# Patient Record
Sex: Female | Born: 1970
Health system: Southern US, Community
[De-identification: ages and names within clinical notes are randomized; demographics above are authoritative.]

## PROBLEM LIST (undated history)

## (undated) DIAGNOSIS — Z8739 Personal history of other diseases of the musculoskeletal system and connective tissue: Secondary | ICD-10-CM

## (undated) DIAGNOSIS — M1990 Unspecified osteoarthritis, unspecified site: Secondary | ICD-10-CM

## (undated) DIAGNOSIS — I10 Essential (primary) hypertension: Secondary | ICD-10-CM

## (undated) DIAGNOSIS — Z460 Encounter for fitting and adjustment of spectacles and contact lenses: Secondary | ICD-10-CM

## (undated) HISTORY — DX: Encounter for fitting and adjustment of spectacles and contact lenses: Z46.0

## (undated) HISTORY — DX: Unspecified osteoarthritis, unspecified site: M19.90

## (undated) HISTORY — DX: Essential (primary) hypertension: I10

## (undated) HISTORY — DX: Personal history of other diseases of the musculoskeletal system and connective tissue: Z87.39

---

## 1998-12-27 ENCOUNTER — Encounter: Admission: RE | Admit: 1998-12-27 | Discharge: 1999-01-18 | Payer: Self-pay

## 2000-01-07 ENCOUNTER — Other Ambulatory Visit: Admission: RE | Admit: 2000-01-07 | Discharge: 2000-01-07 | Payer: Self-pay | Admitting: Family Medicine

## 2000-09-25 ENCOUNTER — Encounter: Admission: RE | Admit: 2000-09-25 | Discharge: 2000-12-24 | Payer: Self-pay | Admitting: Surgical Oncology

## 2001-10-31 ENCOUNTER — Emergency Department (HOSPITAL_COMMUNITY): Admission: EM | Admit: 2001-10-31 | Discharge: 2001-11-01 | Payer: Self-pay | Admitting: Emergency Medicine

## 2001-11-01 ENCOUNTER — Encounter: Payer: Self-pay | Admitting: Emergency Medicine

## 2003-01-12 ENCOUNTER — Emergency Department (HOSPITAL_COMMUNITY): Admission: EM | Admit: 2003-01-12 | Discharge: 2003-01-12 | Payer: Self-pay | Admitting: Emergency Medicine

## 2003-05-27 ENCOUNTER — Encounter: Admission: RE | Admit: 2003-05-27 | Discharge: 2003-07-20 | Payer: Self-pay | Admitting: Orthopedic Surgery

## 2003-09-12 ENCOUNTER — Ambulatory Visit (HOSPITAL_COMMUNITY): Admission: RE | Admit: 2003-09-12 | Discharge: 2003-09-12 | Payer: Self-pay | Admitting: Family Medicine

## 2004-02-27 ENCOUNTER — Emergency Department (HOSPITAL_COMMUNITY): Admission: EM | Admit: 2004-02-27 | Discharge: 2004-02-27 | Payer: Self-pay | Admitting: Emergency Medicine

## 2006-01-09 ENCOUNTER — Other Ambulatory Visit: Admission: RE | Admit: 2006-01-09 | Discharge: 2006-01-09 | Payer: Self-pay | Admitting: Obstetrics and Gynecology

## 2007-07-23 ENCOUNTER — Ambulatory Visit (HOSPITAL_COMMUNITY): Admission: RE | Admit: 2007-07-23 | Discharge: 2007-07-23 | Payer: Self-pay | Admitting: *Deleted

## 2007-07-24 ENCOUNTER — Ambulatory Visit (HOSPITAL_COMMUNITY): Admission: RE | Admit: 2007-07-24 | Discharge: 2007-07-24 | Payer: Self-pay | Admitting: *Deleted

## 2007-08-02 ENCOUNTER — Emergency Department (HOSPITAL_COMMUNITY): Admission: EM | Admit: 2007-08-02 | Discharge: 2007-08-03 | Payer: Self-pay | Admitting: Emergency Medicine

## 2007-08-20 ENCOUNTER — Encounter: Admission: RE | Admit: 2007-08-20 | Discharge: 2007-08-20 | Payer: Self-pay | Admitting: *Deleted

## 2008-02-10 ENCOUNTER — Encounter: Admission: RE | Admit: 2008-02-10 | Discharge: 2008-05-10 | Payer: Self-pay | Admitting: *Deleted

## 2008-02-23 ENCOUNTER — Ambulatory Visit (HOSPITAL_COMMUNITY): Admission: RE | Admit: 2008-02-23 | Discharge: 2008-02-24 | Payer: Self-pay | Admitting: Surgery

## 2008-02-23 HISTORY — PX: LAPAROSCOPIC GASTRIC BANDING: SHX1100

## 2008-06-29 ENCOUNTER — Encounter: Admission: RE | Admit: 2008-06-29 | Discharge: 2008-08-16 | Payer: Self-pay | Admitting: *Deleted

## 2011-04-09 NOTE — Op Note (Signed)
[name], [name]               ACCOUNT NO.:  [id number]   MEDICAL RECORD NO.:  [id number]          PATIENT TYPE:  AMB   LOCATION:  DAY                          FACILITY:  [facility]   PHYSICIAN:  Thornton Park. Daphine Deutscher, MD  DATE OF BIRTH:  21-Oct-1971   DATE OF PROCEDURE:  02/23/2008  DATE OF DISCHARGE:                               OPERATIVE REPORT   PREOPERATIVE DIAGNOSIS:  A 40 year old female with morbid obesity.  BMI  of 41 presents for lap band surgery.   PROCEDURE:  Laparoscopic adjustable band (Allergan) APS placed with an  antislip suture.   SURGEON:  Thornton Park. Daphine Deutscher, MD   ASSISTANT:  None.   ANESTHESIA:  General endotracheal.   DESCRIPTION OF PROCEDURE:  [name] was taken to room 1 and given  general anesthesia.  The abdomen was prepped with Techni-Care and draped  sterilely.  Access was gained through the left upper quadrant using a 0-  degree OptiVu scope, entering the abdomen without difficulty, and then  insufflating.  Standard trocar placements were used including a 5 in the  upper midline to introduce the Nathanson's retractor.  A 15 in the right  upper midline, and a 10/11 just below that.  I subsequently inverted the  port.   The camera port was placed on the left, above the umbilicus.  Abdomen  was surveyed and no abnormalities were noted.  The liver retracted.  I  incised the angle of His, and created a space there fore the band  passer.  I took down the gastrohepatic omentum, and identified the  medial border of the right crus, and then did the standard pars flaccida  approach, passing the band passer, from that point around to the  previously made point on the angle of His.  That came through easily.  It appeared to be right for an APS band which I then placed in the  abdomen, threading it through the band passer, bringing it around, and  snapping the belt buckle on over the of the calibration tube.  Once the  band was snapped in place, it was held down  toward the feet, and then I  plicated anteriorly with free suture of Surgitek, with free sutures and  tie knots.  A little plication stitch toward the lesser curvature was  used to try to prevent herniation through the band.   The band was then placed in the right lower side, and thereafter trocars  removed, the abdomen was deflated, created a space, connected the port,  and then secured it the fascia with four sutures of 2-0 Prolene.   The wound was irrigated well, and all wounds were closed 4-0 Vicryl  subcutaneously and with staples.  The patient tolerated the procedure  and was taken to recovery room in satisfactory condition.      Thornton Park Daphine Deutscher, MD  Electronically Signed     MBM/MEDQ  D:  02/23/2008  T:  02/23/2008  Job:  [id number]   cc:   Shade Flood, M.D.  Fax: [phone]

## 2011-08-19 LAB — HEMOGLOBIN AND HEMATOCRIT, BLOOD
HCT: 38.8
Hemoglobin: 13.2

## 2011-08-19 LAB — PREGNANCY, URINE: Preg Test, Ur: NEGATIVE

## 2011-08-20 LAB — DIFFERENTIAL
Basophils Absolute: 0
Basophils Relative: 1
Eosinophils Absolute: 0
Eosinophils Relative: 0
Lymphocytes Relative: 24
Lymphs Abs: 1.8
Monocytes Absolute: 0.4
Monocytes Relative: 5
Neutro Abs: 5.1
Neutrophils Relative %: 70

## 2011-08-20 LAB — CBC
HCT: 34 — ABNORMAL LOW
Hemoglobin: 11.9 — ABNORMAL LOW
MCHC: 35
MCV: 83.8
Platelets: 282
RBC: 4.06
RDW: 14.3
WBC: 7.3

## 2011-11-12 ENCOUNTER — Ambulatory Visit (INDEPENDENT_AMBULATORY_CARE_PROVIDER_SITE_OTHER): Payer: Federal, State, Local not specified - PPO

## 2011-11-12 DIAGNOSIS — J029 Acute pharyngitis, unspecified: Secondary | ICD-10-CM

## 2011-11-12 DIAGNOSIS — J111 Influenza due to unidentified influenza virus with other respiratory manifestations: Secondary | ICD-10-CM

## 2011-11-12 DIAGNOSIS — R509 Fever, unspecified: Secondary | ICD-10-CM

## 2012-02-10 ENCOUNTER — Ambulatory Visit (INDEPENDENT_AMBULATORY_CARE_PROVIDER_SITE_OTHER): Payer: Federal, State, Local not specified - PPO | Admitting: Physician Assistant

## 2012-02-10 VITALS — BP 142/90 | HR 87 | Temp 97.9°F | Resp 16 | Ht 69.0 in | Wt 215.0 lb

## 2012-02-10 DIAGNOSIS — M545 Low back pain, unspecified: Secondary | ICD-10-CM

## 2012-02-10 DIAGNOSIS — M62838 Other muscle spasm: Secondary | ICD-10-CM

## 2012-02-10 DIAGNOSIS — M549 Dorsalgia, unspecified: Secondary | ICD-10-CM | POA: Insufficient documentation

## 2012-02-10 MED ORDER — HYDROCODONE-ACETAMINOPHEN 5-500 MG PO TABS: 1.0000 | ORAL_TABLET | Freq: Three times a day (TID) | ORAL | Status: AC | PRN

## 2012-02-10 MED ORDER — NAPROXEN 500 MG PO TABS: 500.0000 mg | ORAL_TABLET | Freq: Two times a day (BID) | ORAL | Status: DC

## 2012-02-10 MED ORDER — METHOCARBAMOL 500 MG PO TABS: 500.0000 mg | ORAL_TABLET | Freq: Four times a day (QID) | ORAL | Status: AC

## 2012-02-10 NOTE — Patient Instructions (Signed)
Back Pain, Adult Low back pain is very common. About 1 in 5 people have back pain.The cause of low back pain is rarely dangerous. The pain often gets better over time.About half of people with a sudden onset of back pain feel better in just 2 weeks. About 8 in 10 people feel better by 6 weeks.  CAUSES Some common causes of back pain include:  Strain of the muscles or ligaments supporting the spine.   Wear and tear (degeneration) of the spinal discs.   Arthritis.   Direct injury to the back.  DIAGNOSIS Most of the time, the direct cause of low back pain is not known.However, back pain can be treated effectively even when the exact cause of the pain is unknown.Answering your caregiver's questions about your overall health and symptoms is one of the most accurate ways to make sure the cause of your pain is not dangerous. If your caregiver needs more information, he or she may order lab work or imaging tests (X-rays or MRIs).However, even if imaging tests show changes in your back, this usually does not require surgery. HOME CARE INSTRUCTIONS For many people, back pain returns.Since low back pain is rarely dangerous, it is often a condition that people can learn to manageon their own.   Remain active. It is stressful on the back to sit or stand in one place. Do not sit, drive, or stand in one place for more than 30 minutes at a time. Take short walks on level surfaces as soon as pain allows.Try to increase the length of time you walk each day.   Do not stay in bed.Resting more than 1 or 2 days can delay your recovery.   Do not avoid exercise or work.Your body is made to move.It is not dangerous to be active, even though your back may hurt.Your back will likely heal faster if you return to being active before your pain is gone.   Pay attention to your body when you bend and lift. Many people have less discomfortwhen lifting if they bend their knees, keep the load close to their  bodies,and avoid twisting. Often, the most comfortable positions are those that put less stress on your recovering back.   Find a comfortable position to sleep. Use a firm mattress and lie on your side with your knees slightly bent. If you lie on your back, put a pillow under your knees.   Only take over-the-counter or prescription medicines as directed by your caregiver. Over-the-counter medicines to reduce pain and inflammation are often the most helpful.Your caregiver may prescribe muscle relaxant drugs.These medicines help dull your pain so you can more quickly return to your normal activities and healthy exercise.   Put ice on the injured area.   Put ice in a plastic bag.   Place a towel between your skin and the bag.   Leave the ice on for 15 to 20 minutes, 3 to 4 times a day for the first 2 to 3 days. After that, ice and heat may be alternated to reduce pain and spasms.   Ask your caregiver about trying back exercises and gentle massage. This may be of some benefit.   Avoid feeling anxious or stressed.Stress increases muscle tension and can worsen back pain.It is important to recognize when you are anxious or stressed and learn ways to manage it.Exercise is a great option.  SEEK MEDICAL CARE IF:  You have pain that is not relieved with rest or medicine.   You have   pain that does not improve in 1 week.   You have new symptoms.   You are generally not feeling well.  SEEK IMMEDIATE MEDICAL CARE IF:   You have pain that radiates from your back into your legs.   You develop new bowel or bladder control problems.   You have unusual weakness or numbness in your arms or legs.   You develop nausea or vomiting.   You develop abdominal pain.   You feel faint.  Document Released: 11/11/2005 Document Revised: 10/31/2011 Document Reviewed: 04/01/2011 Exit[employer] Patient Information 2012 [employer]. 

## 2012-02-10 NOTE — Progress Notes (Signed)
  Subjective:    Patient ID: Samantha [name], female    DOB: 1971/01/27, 41 y.o.   MRN: [id number]  HPI 41 y/o BF presents complaining of back pain.   History of recurrent low back pain, flaring roughly once annually.  Has had MRI years ago showing mild DDD without evidence of rupture or herniation.    Current episode began after a 2.5 hour car ride home from a weekend vacation.  Carried her suitcases, perhaps irritated her back with those two things.  Denies N/T/W of LE, no B/B dysfunction, no radiation of pain down her legs or saddle numbness.    Treatment in the past has included NSAIDs, muscle relaxers and opioid pain medicaitons which clears her pain within several days.  She is overweight and knows she needs to lose weight to prevent recurring issues.     Review of Systems Gen:  Overall healthy.  No fever. ABD:  No nausea, no abdominal pain GU:  No incontinence Musc:  Back pain, no leg pain Neuro:  No weakness, no numbness, no paresthesias of LE    Objective:   Physical Exam Gen:  Healthy appearing BF in mild discomfort d/t back pain.  Abdomen:  Non-tender, no flank pain Musc:  Moves slowly in exam room, walking gingerly.  Tender over right SI joint and right lumbar paraspinals without significant spasm.  ROM limited d/t pain, forward flexion most limited.   Neuro:  Reflexes 2+.  Strength 5/5 bilateral LE.  SLR negative bilaterally.      Assessment & Plan:  Back pain.  Musculoskeletal in nature.  Treat with Naprosyn 500mg  BID, Robaxin 500mg  QID, and HC/APAP 5/500 TID prn.  Advise stretching exercises and improving core strength to avoid re-injurying herself in the future.  No concerning signs for worsening disease on exam.

## 2012-02-24 ENCOUNTER — Encounter (INDEPENDENT_AMBULATORY_CARE_PROVIDER_SITE_OTHER): Payer: Self-pay | Admitting: Surgery

## 2012-02-26 ENCOUNTER — Encounter (INDEPENDENT_AMBULATORY_CARE_PROVIDER_SITE_OTHER): Payer: Self-pay | Admitting: Surgery

## 2012-02-26 ENCOUNTER — Ambulatory Visit (INDEPENDENT_AMBULATORY_CARE_PROVIDER_SITE_OTHER): Payer: Federal, State, Local not specified - PPO | Admitting: Surgery

## 2012-02-26 VITALS — BP 143/82 | HR 88 | Temp 98.0°F | Ht 70.5 in | Wt 215.0 lb

## 2012-02-26 DIAGNOSIS — Z9884 Bariatric surgery status: Secondary | ICD-10-CM

## 2012-02-26 DIAGNOSIS — Z4651 Encounter for fitting and adjustment of gastric lap band: Secondary | ICD-10-CM

## 2012-02-26 NOTE — Progress Notes (Signed)
Samantha [name] Body mass index is 30.41 kg/(m^2).  Having regurgitation:  no  Nocturnal reflux?  no  Amount of fill  0.5 Samantha [name] comes in today in followup for years out from her LAP-BAND APS with an anti-slip stitch. Her BMI is down to 38 she has lost at least 80 pounds since her surgery. She looks good but recently has been craving potato chips. She needed given for fill and we're seeing her today. She felt like she needed to more refill. Added half a cc to her band. I suggested she might have point to see me in 3 months if the case she is having some difficulty. Otherwise she looks great and I am proud at how she's done.

## 2012-11-23 ENCOUNTER — Ambulatory Visit (INDEPENDENT_AMBULATORY_CARE_PROVIDER_SITE_OTHER): Payer: Federal, State, Local not specified - PPO | Admitting: Family Medicine

## 2012-11-23 VITALS — BP 168/92 | HR 81 | Temp 98.8°F | Resp 18 | Ht 68.0 in | Wt 213.0 lb

## 2012-11-23 DIAGNOSIS — IMO0001 Reserved for inherently not codable concepts without codable children: Secondary | ICD-10-CM

## 2012-11-23 DIAGNOSIS — M546 Pain in thoracic spine: Secondary | ICD-10-CM

## 2012-11-23 DIAGNOSIS — M791 Myalgia, unspecified site: Secondary | ICD-10-CM

## 2012-11-23 DIAGNOSIS — M549 Dorsalgia, unspecified: Secondary | ICD-10-CM

## 2012-11-23 MED ORDER — CYCLOBENZAPRINE HCL 10 MG PO TABS: 10.0000 mg | ORAL_TABLET | Freq: Three times a day (TID) | ORAL | Status: DC | PRN

## 2012-11-23 MED ORDER — MELOXICAM 15 MG PO TABS: 15.0000 mg | ORAL_TABLET | Freq: Every day | ORAL | Status: DC

## 2012-11-23 MED ORDER — METHYLPREDNISOLONE ACETATE 80 MG/ML IJ SUSP
80.0000 mg | Freq: Once | INTRAMUSCULAR | Status: AC
  Administered 2012-11-23: 80 mg via INTRAMUSCULAR

## 2012-11-23 MED ORDER — HYDROCODONE-ACETAMINOPHEN 5-325 MG PO TABS: 1.0000 | ORAL_TABLET | Freq: Three times a day (TID) | ORAL | Status: DC | PRN

## 2012-11-23 NOTE — Progress Notes (Signed)
Subjective:    Patient ID: Samantha [name], female    DOB: [birth year], 41 y.o.   MRN: [id number] Chief Complaint  Patient presents with  . Back Pain    x 5 days  -NKI-    HPI  41 yo female with a history of chronic problems with her back for years.  Usually pain is in her right low back. This episode is different - she is currently here because she is experiencing bilateral upper back pain and soreness, cannot turn trunk without pain.  Earlier this month her chronic low back pain flaired to the point that she had to stay home from work.  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  No weakness or numbness in any extremities noted. No f/c.  No falls or dropping things, no decreased grasp. No changes in bowels/bladder. Has tried physical therapy, chiropractor, massage therapy w/o help.  Pain is confined to back Had MRI of low back about 15 yrs ago - has been having pain ever since epidural. Going to try acupuncture.  Past Medical History  Diagnosis Date  . Hypertension   . Contact lens/glasses fitting   . History of swelling of feet   . Arthritis    Current Outpatient Prescriptions on File Prior to Visit  Medication Sig Dispense Refill  . medroxyPROGESTERone (DEPO-PROVERA) 150 MG/ML injection Inject 150 mg into the muscle every 3 (three) months.       No Known Allergies  Review of Systems  Constitutional: Negative for fever and chills.  Gastrointestinal: Negative for nausea, vomiting, abdominal pain, diarrhea and constipation.  Genitourinary: Negative for urgency, frequency, decreased urine volume and difficulty urinating.  Musculoskeletal: Positive for myalgias and back pain. Negative for joint swelling, arthralgias and gait problem.  Neurological: Negative for weakness and numbness.  Hematological: Negative for adenopathy. Does not bruise/bleed easily.  Psychiatric/Behavioral: Positive for sleep disturbance.      BP 168/92  Pulse 81  Temp 98.8 F (37.1 C) (Oral)   Resp 18  Ht 5\' 8"  (1.727 m)  Wt 213 lb (96.616 kg)  BMI 32.39 kg/m2  SpO2 100% Objective:   Physical Exam  Constitutional: She is oriented to person, place, and time. She appears well-developed and well-nourished.  HENT:  Head: Normocephalic.  Eyes: Conjunctivae normal are normal. No scleral icterus.  Neck: Normal range of motion. Neck supple.  Cardiovascular: Normal rate, regular rhythm and normal heart sounds.   Pulmonary/Chest: Effort normal and breath sounds normal. No respiratory distress.  Musculoskeletal: Normal range of motion.       Cervical back: Normal.       Thoracic back: She exhibits tenderness, pain and spasm. She exhibits no bony tenderness and no deformity.       Lumbar back: She exhibits no bony tenderness and no deformity.       Back:       Pain in bilateral rhomboids immed proximal to lower scapula  Neurological: She is alert and oriented to person, place, and time. She has normal strength and normal reflexes. She displays normal reflexes. No sensory deficit. Coordination and gait normal.  Reflex Scores:      Bicep reflexes are 2+ on the right side and 2+ on the left side.      Brachioradialis reflexes are 2+ on the right side and 2+ on the left side.      Patellar reflexes are 2+ on the right side and 2+ on the left side. Skin: Skin is warm  and dry. No erythema.  Psychiatric: She has a normal mood and affect. Her behavior is normal.      Assessment & Plan:    1. Myalgia  cyclobenzaprine (FLEXERIL) 10 MG tablet, meloxicam (MOBIC) 15 MG tablet  2. Acute upper back pain - Pt reassured that I think her current pain is just muscle spasm - a common place for people to "hold stress" especially when sitting at a desk on the computer all day.  Rec heat and massage but pt declines as have not helped her low back in the past.  Pt requested a shot of treatment.  Treated w/ Depo-Medrol 80mg  IM x 1. Pt did request a prescription for "something for pain" in addition to the  mobic and flexeril, pt given #10 norco 5/325. methylPREDNISolone acetate (DEPO-MEDROL) injection 80 mg, HYDROcodone-acetaminophen (NORCO/VICODIN) 5-325 MG per tablet  3. Elevated blood pressure - pt attributes to pain but has been elevated at all 3 visits this year.  Pt has a h/o HTN but is not currently on treatment.  If elevated at next visit, rec starting hctz 25mg  qd. 4. Chronic low back pain - Pt is very young to have chronic LBP and having to miss 5 work days this year alone due to chronic back pain seems significant.  I encouraged pt to pursue further w/u since has been 15 yrs since last imaging.  She may want to consider repeat xray or even repeat MRI. Has failed PT, chiropractor, and massage so may need to consider referral to [facility] pain management for epidural steroid injections or more long term pain treatment with TCA, cymbalta, neurontin, etc if acupuncture does not work for her.   I advised pt that if her back pain continues and she wants to consider back injections (or even just trigger point injections), she RTC to discuss options.

## 2013-07-15 ENCOUNTER — Encounter (INDEPENDENT_AMBULATORY_CARE_PROVIDER_SITE_OTHER): Payer: Self-pay | Admitting: Surgery

## 2013-07-15 ENCOUNTER — Ambulatory Visit (INDEPENDENT_AMBULATORY_CARE_PROVIDER_SITE_OTHER): Payer: Federal, State, Local not specified - PPO | Admitting: Surgery

## 2013-07-15 ENCOUNTER — Telehealth (INDEPENDENT_AMBULATORY_CARE_PROVIDER_SITE_OTHER): Payer: Self-pay | Admitting: General Surgery

## 2013-07-15 VITALS — BP 138/100 | HR 72 | Temp 97.9°F | Resp 15 | Ht 71.0 in | Wt 231.2 lb

## 2013-07-15 DIAGNOSIS — Z4651 Encounter for fitting and adjustment of gastric lap band: Secondary | ICD-10-CM

## 2013-07-15 DIAGNOSIS — E049 Nontoxic goiter, unspecified: Secondary | ICD-10-CM

## 2013-07-15 NOTE — Telephone Encounter (Signed)
LMOM asking pt to return my call.  This is so that I may inform her that she is scheduled for an US of the head/neck at [facility] located at [address] on 07/19/13 at 10:15.

## 2013-07-15 NOTE — Progress Notes (Signed)
Lapband Fill Encounter Problem List:   Patient Active Problem List   Diagnosis Date Noted  . 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 Back pain 02/10/2012    Lavone Neri Body mass index is 32.26 kg/(m^2). Weight loss since surgery  65 pounds  Having regurgitation?:  No  Feel that they need a fill? Yes  Nocturnal reflux?  No  Amount of fill  0.9     Instructions given and weight loss goals discussed.    Ms. Bickley is hungry and a bit frustrated because since her last fill in April she has gained 16 pounds. She reports being hungry. She's not having reflux. Her primary care asked her about her thyroid because it seemed like it might be enlarged. On exam her thought she might have some fullness in her right thyroid lobe. We'll obtain a thyroid ultrasound. We'll also check thyroid functions. Will obtain a thyroid panel with TSH. I'll see her back in 6-8 weeks. She may cause and 3 and let us know how restricted she feels.  Matt B. Daphine Deutscher, MD, FACS

## 2013-07-15 NOTE — Patient Instructions (Addendum)

## 2013-07-19 ENCOUNTER — Other Ambulatory Visit: Payer: Self-pay

## 2014-01-20 ENCOUNTER — Encounter (INDEPENDENT_AMBULATORY_CARE_PROVIDER_SITE_OTHER): Payer: Federal, State, Local not specified - PPO

## 2014-02-03 ENCOUNTER — Ambulatory Visit (INDEPENDENT_AMBULATORY_CARE_PROVIDER_SITE_OTHER): Payer: Federal, State, Local not specified - PPO | Admitting: Physician Assistant

## 2014-02-03 ENCOUNTER — Encounter (INDEPENDENT_AMBULATORY_CARE_PROVIDER_SITE_OTHER): Payer: Self-pay

## 2014-02-03 VITALS — BP 120/86 | HR 82 | Temp 97.4°F | Resp 14 | Ht 71.0 in | Wt 241.2 lb

## 2014-02-03 DIAGNOSIS — Z4651 Encounter for fitting and adjustment of gastric lap band: Secondary | ICD-10-CM

## 2014-02-03 NOTE — Progress Notes (Signed)
  HISTORY: Samantha [name] is a 43 y.o.female who received an AP-Standard lap-band in March 2009 by Dr. Daphine DeutscherMartin. She comes in with 10 lbs weight gain since her last visit in August 2014 with Dr. Daphine DeutscherMartin where she received a 0.9 mL fill. She describes good restriction immediately following the fill but this has tapered off over the months. She denies regurgitation or reflux symptoms. Her physical activity has been minimal during the winter months but she's planning on picking this up as the weather improves.  VITAL SIGNS: Filed Vitals:   02/03/14 1438  BP: 120/86  Pulse: 82  Temp: 97.4 F (36.3 C)  Resp: 14    PHYSICAL EXAM: Physical exam reveals a very well-appearing 43 y.o.female in no apparent distress Neurologic: Awake, alert, oriented Psych: Bright affect, conversant Respiratory: Breathing even and unlabored. No stridor or wheezing Abdomen: Soft, nontender, nondistended to palpation. Incisions well-healed. No incisional hernias. Port easily palpated. Extremities: Atraumatic, good range of motion.  ASSESMENT: 43 y.o.  female  s/p AP-Standard lap-band.   PLAN: The patient's port was accessed with a 20G Huber needle without difficulty. Clear fluid was aspirated and 0.5 mL saline was added to the port to give a total predicted volume of 5 mL. This was after verification of 4.5 mL of fluid in the band. The patient was able to swallow water without difficulty following the procedure and was instructed to take clear liquids for the next 24-48 hours and advance slowly as tolerated.

## 2014-02-03 NOTE — Patient Instructions (Signed)

## 2014-05-09 ENCOUNTER — Ambulatory Visit (INDEPENDENT_AMBULATORY_CARE_PROVIDER_SITE_OTHER): Payer: Federal, State, Local not specified - PPO | Admitting: Family Medicine

## 2014-05-09 VITALS — BP 126/88 | HR 87 | Temp 98.0°F | Resp 20 | Ht 68.0 in | Wt 235.4 lb

## 2014-05-09 DIAGNOSIS — S239XXA Sprain of unspecified parts of thorax, initial encounter: Secondary | ICD-10-CM

## 2014-05-09 DIAGNOSIS — S29012A Strain of muscle and tendon of back wall of thorax, initial encounter: Secondary | ICD-10-CM

## 2014-05-09 DIAGNOSIS — M79605 Pain in left leg: Secondary | ICD-10-CM

## 2014-05-09 DIAGNOSIS — M79609 Pain in unspecified limb: Secondary | ICD-10-CM

## 2014-05-09 MED ORDER — CYCLOBENZAPRINE HCL 10 MG PO TABS: 10.0000 mg | ORAL_TABLET | Freq: Three times a day (TID) | ORAL | Status: DC | PRN

## 2014-05-09 MED ORDER — NAPROXEN 500 MG PO TABS: 500.0000 mg | ORAL_TABLET | Freq: Two times a day (BID) | ORAL | Status: DC

## 2014-05-09 MED ORDER — METHYLPREDNISOLONE ACETATE 80 MG/ML IJ SUSP
80.0000 mg | Freq: Once | INTRAMUSCULAR | Status: AC
  Administered 2014-05-09: 80 mg via INTRAMUSCULAR

## 2014-05-09 MED ORDER — HYDROCODONE-ACETAMINOPHEN 5-325 MG PO TABS: 1.0000 | ORAL_TABLET | ORAL | Status: DC | PRN

## 2014-05-09 NOTE — Progress Notes (Signed)
Subjective: Patient presents with history of having developed low back pain last couple days. She's been a lot of progress activities that might carotid on. Knows of no exact specifics stream. It also started hurting in her right shoulder. She had been doing a lot of moving and lifting. Has had problems intermittently with a lot of aches and pains in the past.  Objective: No acute distress. Neck his range of motion. She's tender just below her right scapula and down below that is somewhat deep to the scapula. Good range of motion of the shoulder which itself is nontender. Strength is good. Reflexes good. Back is tender in the lower and mid lumbar regions straight leg raising test negative. Extremities unremarkable. He can reflexes of the lower extremities normal. Strength good. Range of motion good.  Assessment: Low back pain Right shoulder strain  Plan: Muscle relaxants and anti-inflammatory medication. Return if not improving. Use ice and heat alternating.

## 2014-05-09 NOTE — Patient Instructions (Signed)
Take the cyclobenzaprine one 3 times daily as needed for muscle accident  Use the naproxen one twice daily for pain and inflammation  Take the hydrocodone on an as-needed basis for the pain, maximum at 4 hour intervals  Return if worse or not improving  Use ice, or alternate ice and heat on the painful areas several times daily

## 2014-05-10 ENCOUNTER — Encounter: Payer: Self-pay | Admitting: Family Medicine

## 2014-06-25 ENCOUNTER — Other Ambulatory Visit: Payer: Self-pay | Admitting: Family Medicine

## 2014-06-27 NOTE — Telephone Encounter (Signed)
Dr Alwyn RenHopper, do you want to RF or pt RTC?

## 2015-05-23 ENCOUNTER — Ambulatory Visit (INDEPENDENT_AMBULATORY_CARE_PROVIDER_SITE_OTHER): Payer: Federal, State, Local not specified - PPO | Admitting: Family Medicine

## 2015-05-23 ENCOUNTER — Ambulatory Visit (INDEPENDENT_AMBULATORY_CARE_PROVIDER_SITE_OTHER): Payer: Federal, State, Local not specified - PPO

## 2015-05-23 VITALS — BP 142/90 | HR 61 | Temp 98.2°F | Resp 18 | Ht 69.75 in | Wt 262.4 lb

## 2015-05-23 DIAGNOSIS — M79605 Pain in left leg: Secondary | ICD-10-CM | POA: Diagnosis not present

## 2015-05-23 DIAGNOSIS — M545 Low back pain: Secondary | ICD-10-CM

## 2015-05-23 LAB — POCT URINE PREGNANCY: Preg Test, Ur: NEGATIVE

## 2015-05-23 MED ORDER — HYDROCODONE-ACETAMINOPHEN 5-325 MG PO TABS: 1.0000 | ORAL_TABLET | Freq: Three times a day (TID) | ORAL | Status: DC | PRN

## 2015-05-23 MED ORDER — CYCLOBENZAPRINE HCL 10 MG PO TABS: 10.0000 mg | ORAL_TABLET | Freq: Two times a day (BID) | ORAL | Status: DC | PRN

## 2015-05-23 MED ORDER — METHYLPREDNISOLONE ACETATE 80 MG/ML IJ SUSP
80.0000 mg | Freq: Once | INTRAMUSCULAR | Status: AC
  Administered 2015-05-23: 80 mg via INTRAMUSCULAR

## 2015-05-23 NOTE — Patient Instructions (Signed)
You got a shot of steroids today Use the flexeril as needed for muscle spasm and the hydrocodone as needed for pain.  Remember that both of these medications can make you feel sleepy- taking both together will make you more sleepy! Let me know if you do not feel better soon- Sooner if worse.  Avoid NSAID medications such as aleve or ibuprofen for 2-3 days after your steroid injection; after this time period you can use the medications as needed

## 2015-05-23 NOTE — Progress Notes (Signed)
[facility] [address], [place] [id number] [phone]  Date:  05/23/2015   Name:  Samantha [name]   DOB:  11/15/1971   MRN:  [id number]  PCP:  Shade FloodGREENE,JEFFREY R, MD    Chief Complaint: Back Pain   History of Present Illness:  Samantha Neriamela D Krygier is a 44 y.o. very pleasant female patient who presents with the following:  Here today with complaint of back pain. She has had issues with her back for some time- we have seen her for this a few times since about 2013.  She has had back pain for about 18 years.  Her pain will wax and wane- current episode started about 4 days ago, much worse yesterday.   No history of back surgery.  She did have a couple of MRI scans in the distant pass.   Over the last few days she has tried some aleve, heating pad.   The pain does not go into her legs.  No leg numbness or weakness.  No bowel or bladder control issues  Wt Readings from Last 3 Encounters:  05/23/15 262 lb 6.4 oz (119.024 kg)  05/09/14 235 lb 6.4 oz (106.777 kg)  02/03/14 241 lb 3.2 oz (109.408 kg)   She has sx with her back maybe 3x a week, more severe pain once a week.   She tries to exercise but has not been to the gym in a couple of weeks due to car trouble and then her back issues  Last depo shot 6 months ago.  She did have some spotting a couple of weeks ago.    Patient Active Problem List   Diagnosis Date Noted  . 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 Back pain 02/10/2012    Past Medical History  Diagnosis Date  . Hypertension   . Contact lens/glasses fitting   . History of swelling of feet   . Arthritis     Past Surgical History  Procedure Laterality Date  . Laparoscopic gastric banding  02/23/2008  . Cesarean section  1997    History  Substance Use Topics  . Smoking status: Never Smoker   . Smokeless tobacco: Not on file  . Alcohol Use: Yes     Comment: ocassionally    Family History  Problem Relation Age of Onset  . Hypertension Mother    . Hypertension Father     No Known Allergies  Medication list has been reviewed and updated.  Current Outpatient Prescriptions on File Prior to Visit  Medication Sig Dispense Refill  . cyclobenzaprine (FLEXERIL) 10 MG tablet TAKE 1 TABLET (10 MG TOTAL) BY MOUTH 3 (THREE) TIMES DAILY AS NEEDED FOR MUSCLE SPASMS. (Patient not taking: Reported on 05/23/2015) 21 tablet 0  . HYDROcodone-acetaminophen (NORCO) 5-325 MG per tablet Take 1 tablet by mouth every 4 (four) hours as needed. (Patient not taking: Reported on 05/23/2015) 15 tablet 0  . naproxen (NAPROSYN) 500 MG tablet Take 1 tablet (500 mg total) by mouth 2 (two) times daily with a meal. (Patient not taking: Reported on 05/23/2015) 30 tablet 0   No current facility-administered medications on file prior to visit.    Review of Systems:  As per HPI- otherwise negative.   Physical Examination: Filed Vitals:   05/23/15 0911  BP: 142/90  Pulse: 61  Temp: 98.2 F (36.8 C)  Resp: 18   Filed Vitals:   05/23/15 0911  Height: 5' 9.75" (1.772 m)  Weight: 262 lb 6.4 oz (  119.024 kg)   Body mass index is 37.91 kg/(m^2). Ideal Body Weight: Weight in (lb) to have BMI = 25: 172.6  GEN: WDWN, NAD, Non-toxic, A & O x 3, obese, looks well except moving slowly with back pain HEENT: Atraumatic, Normocephalic. Neck supple. No masses, No LAD. Ears and Nose: No external deformity. CV: RRR, No M/G/R. No JVD. No thrill. No extra heart sounds. PULM: CTA B, no wheezes, crackles, rhonchi. No retractions. No resp. distress. No accessory muscle use. ABD: S, NT, ND\. She has tenderness and spasm in the right lower back muscles.  Restricted flexion due to pain No bony tenderness Normal BLE strength, sensation and DTR, negative SLR bilaterally  EXTR: No c/c/e NEURO Normal gait.  PSYCH: Normally interactive. Conversant. Not depressed or anxious appearing.  Calm demeanor.   Results for orders placed or performed in visit on 05/23/15  POCT urine  pregnancy  Result Value Ref Range   Preg Test, Ur Negative Negative    [facility] reading (PRIMARY) by  Dr. Patsy Lager. Lumbar spine: minimal degenerative change at L4/5  Assessment and Plan: Right low back pain, with sciatica presence unspecified - Plan: DG Lumbar Spine Complete, POCT urine pregnancy, cyclobenzaprine (FLEXERIL) 10 MG tablet, HYDROcodone-acetaminophen (NORCO/VICODIN) 5-325 MG per tablet, methylPREDNISolone acetate (DEPO-MEDROL) injection 80 mg  Left leg pain  Recurrent lower back pain- seems muscular in origin. She has had success with depo- medrol in the past and requests same.  Also will treat with flexeril/ vicodin as needed Follow-up if not better soon Amenorrhea due to recent depo- provera use, HCG negative   Signed Abbe Amsterdam, MD

## 2016-06-10 IMAGING — CR DG LUMBAR SPINE COMPLETE 4+V
4 series · 4 of 4 positions shown · non-contrast
Comparison: None.

CLINICAL DATA: Right-sided low back pain, no injury

EXAM:
LUMBAR SPINE - COMPLETE 4+ VIEW

[AP (1 of 2)]
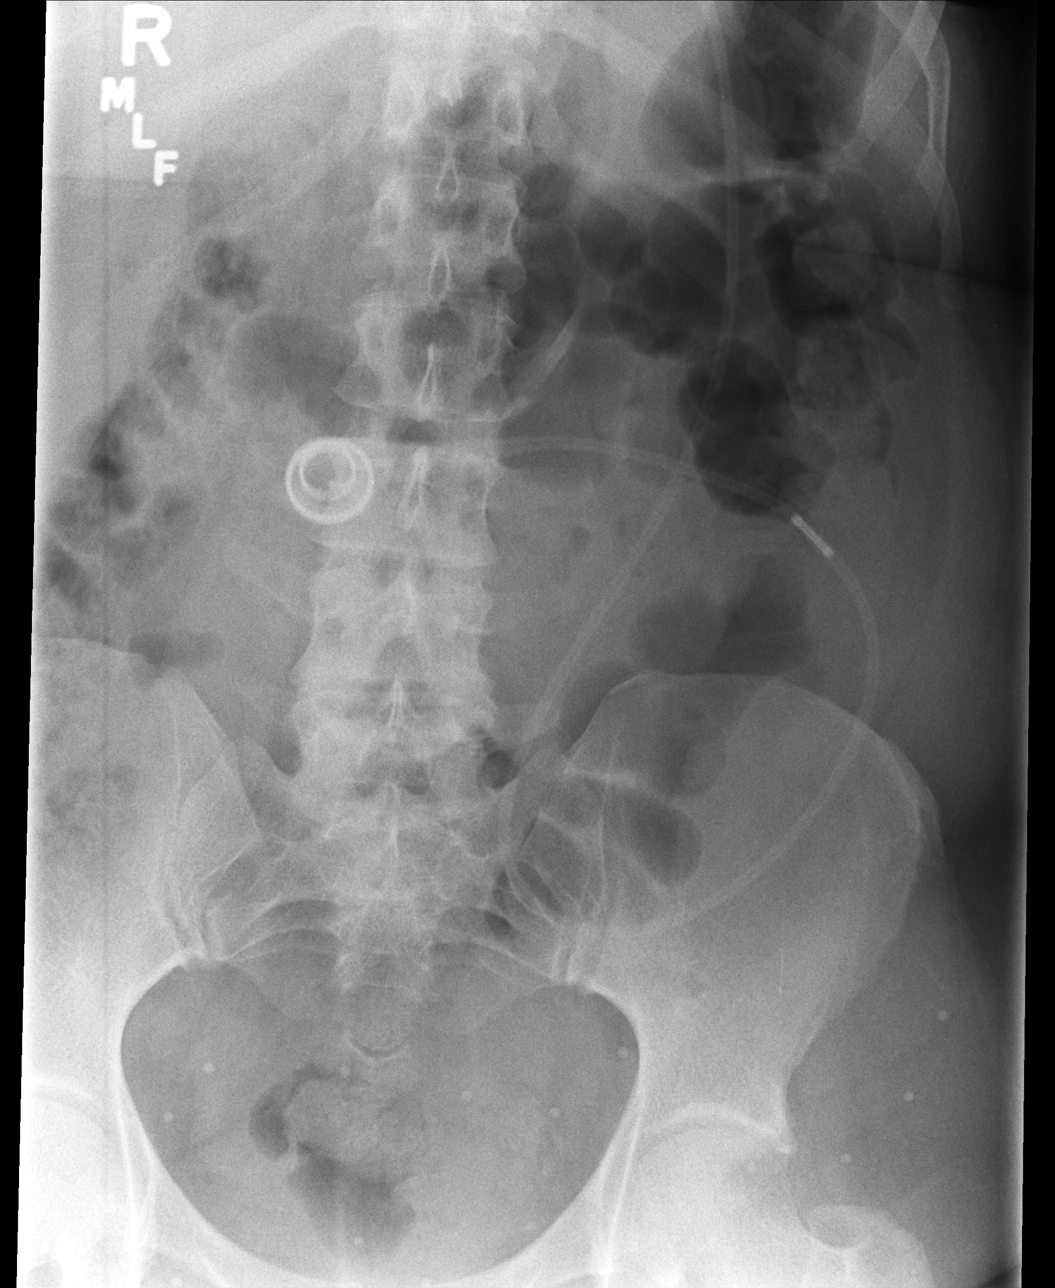

[rpo]
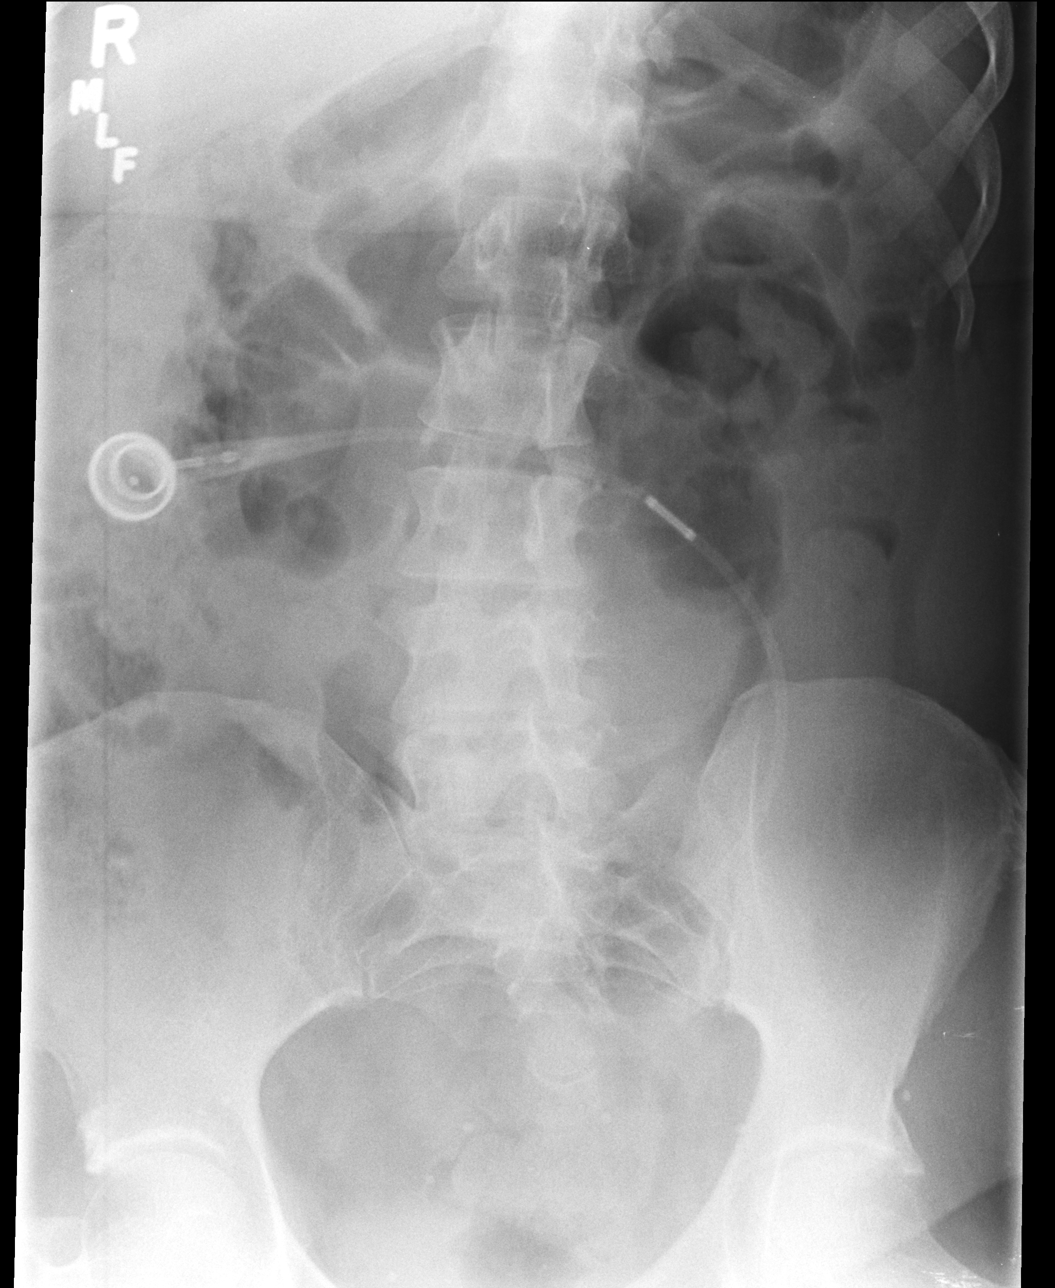

[lpo]
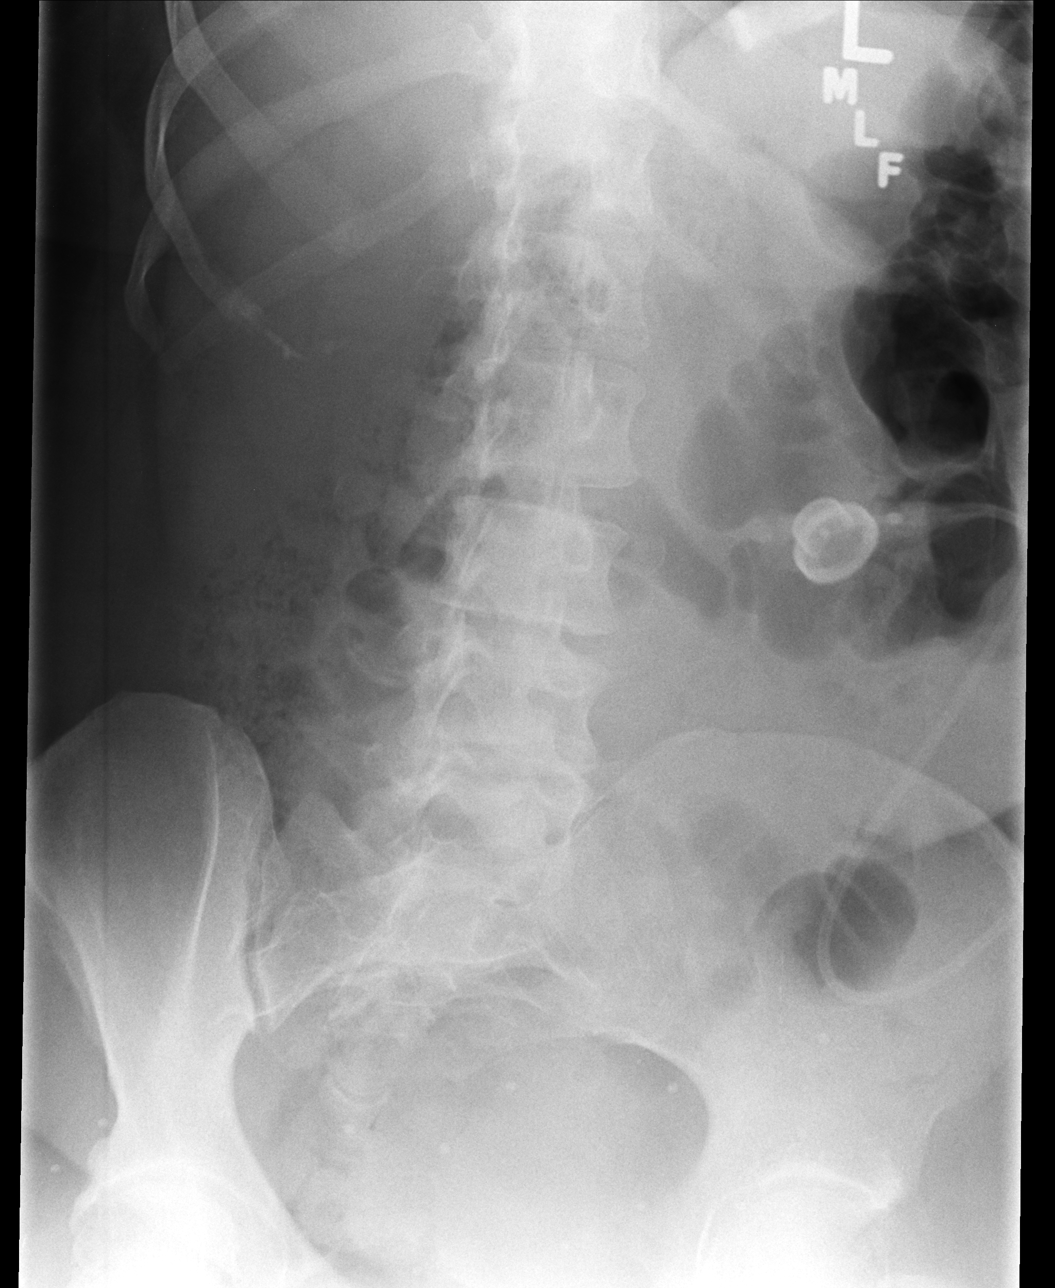

[AP (2 of 2)]
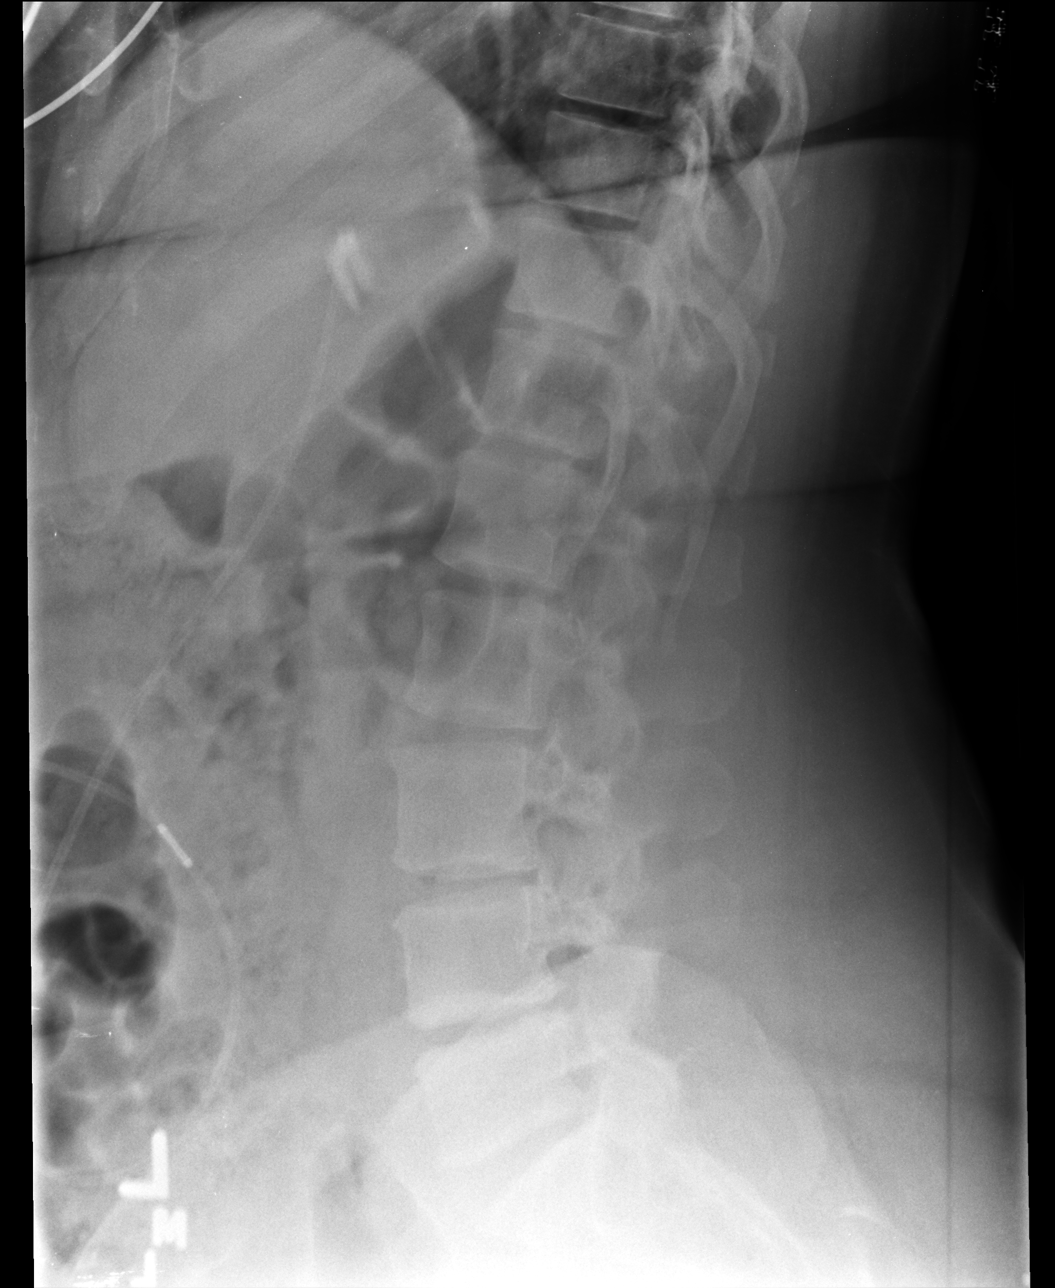

[4 of 4 positions shown; findings below may reference images not displayed]

FINDINGS: The lumbar vertebrae are in normal alignment. There is degenerative
disc disease at L4-5 where there is some loss of disc space and
sclerosis with mild spurring. No compression deformity is seen. A
lap band is noted although the band is not completely imaged.
IMPRESSION: Normal alignment.  Degenerative disc disease at L4-5.

## 2016-07-10 DIAGNOSIS — K080 Exfoliation of teeth due to systemic causes: Secondary | ICD-10-CM | POA: Diagnosis not present

## 2016-09-11 ENCOUNTER — Encounter: Payer: Self-pay | Admitting: Family Medicine

## 2016-09-11 ENCOUNTER — Ambulatory Visit (INDEPENDENT_AMBULATORY_CARE_PROVIDER_SITE_OTHER): Payer: Federal, State, Local not specified - PPO | Admitting: Family Medicine

## 2016-09-11 VITALS — BP 126/82 | HR 89 | Temp 98.6°F | Resp 18 | Ht 69.75 in | Wt 268.0 lb

## 2016-09-11 DIAGNOSIS — M545 Low back pain: Secondary | ICD-10-CM | POA: Diagnosis not present

## 2016-09-11 DIAGNOSIS — G8929 Other chronic pain: Secondary | ICD-10-CM | POA: Diagnosis not present

## 2016-09-11 MED ORDER — METHYLPREDNISOLONE ACETATE 80 MG/ML IJ SUSP
80.0000 mg | Freq: Once | INTRAMUSCULAR | Status: AC
  Administered 2016-09-11: 80 mg via INTRAMUSCULAR

## 2016-09-11 MED ORDER — CYCLOBENZAPRINE HCL 10 MG PO TABS: 10.0000 mg | ORAL_TABLET | Freq: Two times a day (BID) | ORAL | 0 refills | Status: DC | PRN

## 2016-09-11 MED ORDER — HYDROCODONE-ACETAMINOPHEN 5-325 MG PO TABS: 1.0000 | ORAL_TABLET | Freq: Three times a day (TID) | ORAL | 0 refills | Status: DC | PRN

## 2016-09-11 NOTE — Progress Notes (Signed)
Samantha [name] is a 45 y.o. female who presents to [facility] today for chronic low back pain:  1.  Chronic low back pain: Describes aching pain in right lumbar region of back, worse when prolonged standing or sitting.  Not worse on left versus right side. This is been a chronic issue for her since the birth of her son. Patient attributes this to epidural use. She has had intermittent flares for the past 20 years. These usually last 1-2 days at a time. However for the past several years that has been worsening to the point where she is to ambulate with a cane intermittently. The past 3 months she has been using cane almost every single day. Pain does not radiate to her legs. She occasionally has some numbness and tingling on the right side of her legs and feet. No actual lower extremity weakness that she's noted. No falls. She does feel a little unsteady on her feet secondary to his pain does not relate without a cane for the following.  No recent trauma. No fevers or chills.  No dysuria, hematuria, urinary frequency, incontinence of bladder or bowel.    Had MRI 15 - 18 years ago and told she had "bulging disc."  Lumbar xray last year with degenerative disease.  ROS as above.    PMH reviewed. Patient is a nonsmoker.   Past Medical History:  Diagnosis Date  . Arthritis   . Contact lens/glasses fitting   . History of swelling of feet   . Hypertension    Past Surgical History:  Procedure Laterality Date  . CESAREAN SECTION  1997  . LAPAROSCOPIC GASTRIC BANDING  02/23/2008    Medications reviewed. Current Outpatient Prescriptions  Medication Sig Dispense Refill  . cyclobenzaprine (FLEXERIL) 10 MG tablet Take 1 tablet (10 mg total) by mouth 2 (two) times daily as needed for muscle spasms. (Patient not taking: Reported on 09/11/2016) 30 tablet 0  . HYDROcodone-acetaminophen (NORCO/VICODIN) 5-325 MG per tablet Take 1 tablet by mouth every 8 (eight) hours as needed.  (Patient not taking: Reported on 09/11/2016) 10 tablet 0   No current facility-administered medications for this visit.      Physical Exam:  BP 126/82 (BP Location: Right Arm, Patient Position: Sitting, Cuff Size: Large)   Pulse 89   Temp 98.6 F (37 C) (Oral)   Resp 18   Ht 5' 9.75" (1.772 m)   Wt 268 lb (121.6 kg)   SpO2 100%   BMI 38.73 kg/m  Gen:  Alert, cooperative patient who appears stated age in no acute distress.  Vital signs reviewed. Back:  Normal skin, Spine with normal alignment and no deformity.  No tenderness to vertebral process palpation.  Paraspinous muscles are tender and with spasm noted Right lumbar region.   Range of motion is full at neck and lumbar sacral regions.  Straight leg raise is positive for low back pain on Right. Neuro:  Sensation and motor function 5/5 bilateral lower extremities.  Patellar and Achilles  DTR's +2 patellar on Left, +1 on Right.  She ambulates with a cane.   Assessment and Plan:  1.  Chronic low back pain: - Long-standing issue for patient. -She has previously had imaging but this was at least 15 years ago. -She is now having worsening symptoms for the past 3 months such that she can't ambulate except with a cane. -States she was seen by back specialist but again this was at least over a decade  ago. -Treated with oral analgesics, muscle relaxer, cortisone injection here. Also scheduling for MRI due to worsening and fairly new onset of neck surgery paresthesias. Will possibly need referral based on results of imaging.

## 2016-09-11 NOTE — Patient Instructions (Addendum)
Your CT scan is scheduled for Monday 09/16/16 at 1pm at [facility].  It was good to meet you today.  I have completed your paperwork.  Take the Flexeril as a muscle relaxer at night. This may make you drowsy and if it does do not take it during the day.  Take the Vicodin as needed for pain relief.  This will also make you sleepy.  We will get an MRI to take a look at what's going on.  Heat and massage are also great to help relieve the pain.  This can last for the next 7-10 days. If you're still having issues in the next 2 weeks come back and see us.  If you start having worsening pain despite the treatment don't wait and come back immediately.     IF you received an x-ray today, you will receive an invoice from [facility]. Please contact [facility] at [phone] with questions or concerns regarding your invoice.   IF you received labwork today, you will receive an invoice from [facility]. Please contact [facility] at [phone] with questions or concerns regarding your invoice.   Our billing staff will not be able to assist you with questions regarding bills from these companies.  You will be contacted with the lab results as soon as they are available. The fastest way to get your results is to activate your My Chart account. Instructions are located on the last page of this paperwork. If you have not heard from us regarding the results in 2 weeks, please contact this office.

## 2016-09-11 NOTE — Progress Notes (Signed)
B

## 2016-09-16 ENCOUNTER — Ambulatory Visit (HOSPITAL_COMMUNITY)
Admission: RE | Admit: 2016-09-16 | Discharge: 2016-09-16 | Disposition: A | Discharge status: Home / Self Care | Payer: Federal, State, Local not specified - PPO | Source: Ambulatory Visit | Attending: Family Medicine | Admitting: Family Medicine

## 2016-09-16 DIAGNOSIS — M5126 Other intervertebral disc displacement, lumbar region: Secondary | ICD-10-CM | POA: Insufficient documentation

## 2016-09-16 DIAGNOSIS — G8929 Other chronic pain: Secondary | ICD-10-CM | POA: Insufficient documentation

## 2016-09-16 DIAGNOSIS — K8020 Calculus of gallbladder without cholecystitis without obstruction: Secondary | ICD-10-CM | POA: Diagnosis not present

## 2016-09-16 DIAGNOSIS — M545 Low back pain: Secondary | ICD-10-CM

## 2016-09-16 DIAGNOSIS — M5137 Other intervertebral disc degeneration, lumbosacral region: Secondary | ICD-10-CM | POA: Insufficient documentation

## 2016-09-19 ENCOUNTER — Telehealth: Payer: Self-pay | Admitting: Family Medicine

## 2016-09-19 DIAGNOSIS — M5126 Other intervertebral disc displacement, lumbar region: Secondary | ICD-10-CM

## 2016-09-19 DIAGNOSIS — M5136 Other intervertebral disc degeneration, lumbar region: Secondary | ICD-10-CM

## 2016-09-19 NOTE — Telephone Encounter (Signed)
Called and discussed MRI results with patient.  She desires referral to both back specialist and pain management for further recommendations.  I have placed these referrals today.  She was appreciative of call.

## 2016-09-23 ENCOUNTER — Other Ambulatory Visit: Payer: Self-pay | Admitting: Family Medicine

## 2016-10-21 ENCOUNTER — Telehealth: Payer: Self-pay

## 2016-10-21 DIAGNOSIS — M5416 Radiculopathy, lumbar region: Secondary | ICD-10-CM | POA: Diagnosis not present

## 2016-10-21 DIAGNOSIS — G8929 Other chronic pain: Secondary | ICD-10-CM | POA: Diagnosis not present

## 2016-10-21 NOTE — Telephone Encounter (Signed)
Pt is calling and would like her pain mgmt referral sent to Dr. Thyra BreedMark phillips she has an appointment with the one in [place] we referred her too but it is too far of a drive to continue going to   [phone]

## 2016-10-22 NOTE — Telephone Encounter (Signed)
Sending to Dr Thyra BreedMark Phillips -- [facility] for provider review.

## 2016-11-12 DIAGNOSIS — G8929 Other chronic pain: Secondary | ICD-10-CM | POA: Diagnosis not present

## 2016-11-12 DIAGNOSIS — M5416 Radiculopathy, lumbar region: Secondary | ICD-10-CM | POA: Diagnosis not present

## 2016-11-12 DIAGNOSIS — Z79899 Other long term (current) drug therapy: Secondary | ICD-10-CM | POA: Diagnosis not present

## 2016-12-19 DIAGNOSIS — Z79899 Other long term (current) drug therapy: Secondary | ICD-10-CM | POA: Diagnosis not present

## 2016-12-19 DIAGNOSIS — M5416 Radiculopathy, lumbar region: Secondary | ICD-10-CM | POA: Diagnosis not present

## 2016-12-19 DIAGNOSIS — G8929 Other chronic pain: Secondary | ICD-10-CM | POA: Diagnosis not present

## 2016-12-23 ENCOUNTER — Ambulatory Visit (INDEPENDENT_AMBULATORY_CARE_PROVIDER_SITE_OTHER): Payer: Federal, State, Local not specified - PPO | Admitting: Family Medicine

## 2016-12-23 VITALS — HR 114 | Temp 98.0°F | Resp 18 | Ht 69.75 in | Wt 259.0 lb

## 2016-12-23 DIAGNOSIS — Z131 Encounter for screening for diabetes mellitus: Secondary | ICD-10-CM | POA: Diagnosis not present

## 2016-12-23 DIAGNOSIS — G8929 Other chronic pain: Secondary | ICD-10-CM | POA: Diagnosis not present

## 2016-12-23 DIAGNOSIS — M545 Low back pain, unspecified: Secondary | ICD-10-CM

## 2016-12-23 LAB — GLUCOSE, POCT (MANUAL RESULT ENTRY): POC Glucose: 89 mg/dl (ref 70–99)

## 2016-12-23 MED ORDER — METHYLPREDNISOLONE ACETATE 80 MG/ML IJ SUSP
80.0000 mg | Freq: Once | INTRAMUSCULAR | Status: AC
  Administered 2016-12-23: 80 mg via INTRAMUSCULAR

## 2016-12-23 NOTE — Progress Notes (Signed)
Subjective:  By signing my name below, I, [name], attest that this documentation has been prepared under the direction and in the presence of [name], MD.  Electronically Signed: Andrew Au, ED Scribe. 12/23/2016. 5:59 PM.   Patient ID: Samantha [name], female    DOB: 07-12-1971, 46 y.o.   MRN: [id number]  HPI Chief Complaint  Patient presents with  . Back Pain    HPI Comments: Samantha [name] is a 46 y.o. female who presents to the Primary Care at [facility] complaining of back pain.  Hx of chronic low back pain on review. Was most recently seen 10/18 by Dr. Gwendolyn Grant. At that time complained of aching pain in right lower lumbar back attributed to early epidural use with flare for over 20 years that last 1-2 days at a time. Reported worsening pain the past few year with need to use cane intermittently. Reported using cane almost every day over the prior 3 months. Before that visit did report right leg/foot disesthesia. She was treated with flexeril , Vicodin, cortisone injection. MRI lumbar spine on 10/23 indicated central disc protrusion L4-5, mild facet disease, resulting in mild to moderate stenosis, small disc protrusion L3-4, mild disc bulge L5-S1. She was referred to back specialist, [facility], as well as pain management, Dr. Thyra Breed    Pt report back pain started yesterday while walking in [facility]. Pt describes pain as a sudden grabbing pain. She finds that steriod injection have worked best for this type of pain in the past; last injection was by Dr. Gwendolyn Grant in October.  She is followed by Dr. Tyler Deis in [place] for pain management. She recently had an appointment with him 4 days ago. She is on Vicodin and flexeril for chronic pain, which she takes every day twice a day most days. Pt has not found relief to back pain with this regimen. She had to reschedule her appointment with neurosurgeon. She denies fever, chills, bladder and bowel incontinence, rash  and blister. She denies known hx of DM or elevated and has not had testing recently.   Patient Active Problem List   Diagnosis Date Noted  . 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 Back pain 02/10/2012   Past Medical History:  Diagnosis Date  . Arthritis   . Contact lens/glasses fitting   . History of swelling of feet   . Hypertension    Past Surgical History:  Procedure Laterality Date  . CESAREAN SECTION  1997  . LAPAROSCOPIC GASTRIC BANDING  02/23/2008   No Known Allergies Prior to Admission medications   Medication Sig Start Date End Date Taking? Authorizing Provider  cyclobenzaprine (FLEXERIL) 10 MG tablet Take 1 tablet (10 mg total) by mouth 2 (two) times daily as needed for muscle spasms. 09/11/16  Yes Tobey Grim, MD  HYDROcodone-acetaminophen (NORCO/VICODIN) 5-325 MG tablet Take 1 tablet by mouth every 8 (eight) hours as needed. 09/11/16  Yes Tobey Grim, MD   Social History   Social History  . Marital status: Single    Spouse name: N/A  . Number of children: N/A  . Years of education: N/A   Occupational History  . Not on file.   Social History Main Topics  . Smoking status: Never Smoker  . Smokeless tobacco: Never Used  . Alcohol use Yes     Comment: ocassionally  . Drug use: No  . Sexual activity: Yes   Other Topics Concern  . Not on file  Social History Narrative  . No narrative on file   Review of Systems  Constitutional: Negative for chills and fever.  Genitourinary: Negative for difficulty urinating and enuresis.  Musculoskeletal: Positive for back pain and myalgias.  Skin: Negative for color change, rash and wound.  Neurological: Negative for weakness and numbness.     Objective:   Physical Exam  Constitutional: She is oriented to person, place, and time. She appears well-developed and well-nourished. No distress.  HENT:  Head: Normocephalic and atraumatic.  Eyes: Conjunctivae and EOM are normal.  Neck: Neck supple.    Cardiovascular: Normal rate.   Pulmonary/Chest: Effort normal.  Musculoskeletal: Normal range of motion.  Resisted to ROM testing due to discomfort. Negative seated straight leg raise.   Neurological: She is alert and oriented to person, place, and time.  Reflex Scores:      Patellar reflexes are 2+ on the right side and 2+ on the left side.      Achilles reflexes are 2+ on the right side and 2+ on the left side. Skin: Skin is warm and dry.  Psychiatric: She has a normal mood and affect. Her behavior is normal.  Nursing note and vitals reviewed.   Vitals:   12/23/16 1749  Pulse: (!) 114  Resp: 18  Temp: 98 F (36.7 C)  TempSrc: Oral  SpO2: 99%  Weight: 259 lb (117.5 kg)  Height: 5' 9.75" (1.772 m)    Assessment & Plan:    Samantha Neriamela D Granberg is a 46 y.o. female Acute exacerbation of chronic low back pain - Plan: methylPREDNISolone acetate (DEPO-MEDROL) injection 80 mg  Screening for diabetes mellitus - Plan: POCT glucose (manual entry) Results for orders placed or performed in visit on 12/23/16  POCT glucose (manual entry)  Result Value Ref Range   POC Glucose 89 70 - 99 mg/dl    Acute flare of chronic back pain, history of bulging disc, symptoms appear to be consistent with flare of disc disease. No red flags on history or exam, but guarded range of motion due to pain.  -Continue muscle relaxant, hydrocodone as prescribed by her pain management provider.  -Depo-Medrol 80 mg injection given  -Follow-up with neurosurgery as planned.  -ER/RTC precautions discussed   Meds ordered this encounter  Medications  . methylPREDNISolone acetate (DEPO-MEDROL) injection 80 mg   Patient Instructions   Steroid shot given today same as during October visit. Continue your usual pain medications at home, and can discuss with your pain management provider how to dose those during acute flares. Follow-up with neurosurgery as planned to discuss other management options for your back pain  as there is a potential to have another flare as you are currently having. If any fevers, or pain is not improving with steroid injection as has improved in past, return for recheck.  If loss of control of urine or stool, numbness in genital area, or weakness of legs - go to emergency room.    Return to the clinic or go to the nearest emergency room if any of your symptoms worsen or new symptoms occur.    Back Pain, Adult Back pain is very common in adults.The cause of back pain is rarely dangerous and the pain often gets better over time.The cause of your back pain may not be known. Some common causes of back pain include:  Strain of the muscles or ligaments supporting the spine.  Wear and tear (degeneration) of the spinal disks.  Arthritis.  Direct injury to the back.  For many people, back pain may return. Since back pain is rarely dangerous, most people can learn to manage this condition on their own. Follow these instructions at home: Watch your back pain for any changes. The following actions may help to lessen any discomfort you are feeling:  Remain active. It is stressful on your back to sit or stand in one place for long periods of time. Do not sit, drive, or stand in one place for more than 30 minutes at a time. Take short walks on even surfaces as soon as you are able.Try to increase the length of time you walk each day.  Exercise regularly as directed by your health care provider. Exercise helps your back heal faster. It also helps avoid future injury by keeping your muscles strong and flexible.  Do not stay in bed.Resting more than 1-2 days can delay your recovery.  Pay attention to your body when you bend and lift. The most comfortable positions are those that put less stress on your recovering back. Always use proper lifting techniques, including:  Bending your knees.  Keeping the load close to your body.  Avoiding twisting.  Find a comfortable position to sleep.  Use a firm mattress and lie on your side with your knees slightly bent. If you lie on your back, put a pillow under your knees.  Avoid feeling anxious or stressed.Stress increases muscle tension and can worsen back pain.It is important to recognize when you are anxious or stressed and learn ways to manage it, such as with exercise.  Take medicines only as directed by your health care provider. Over-the-counter medicines to reduce pain and inflammation are often the most helpful.Your health care provider may prescribe muscle relaxant drugs.These medicines help dull your pain so you can more quickly return to your normal activities and healthy exercise.  Apply ice to the injured area:  Put ice in a plastic bag.  Place a towel between your skin and the bag.  Leave the ice on for 20 minutes, 2-3 times a day for the first 2-3 days. After that, ice and heat may be alternated to reduce pain and spasms.  Maintain a healthy weight. Excess weight puts extra stress on your back and makes it difficult to maintain good posture. Contact a health care provider if:  You have pain that is not relieved with rest or medicine.  You have increasing pain going down into the legs or buttocks.  You have pain that does not improve in one week.  You have night pain.  You lose weight.  You have a fever or chills. Get help right away if:  You develop new bowel or bladder control problems.  You have unusual weakness or numbness in your arms or legs.  You develop nausea or vomiting.  You develop abdominal pain.  You feel faint. This information is not intended to replace advice given to you by your health care provider. Make sure you discuss any questions you have with your health care provider. Document Released: 11/11/2005 Document Revised: 03/21/2016 Document Reviewed: 03/15/2014 Elsevier Interactive Patient Education  2017 [employer].    IF you received an x-ray today, you will receive an  invoice from [facility]. Please contact [facility] at [phone] with questions or concerns regarding your invoice.   IF you received labwork today, you will receive an invoice from [facility]. Please contact [facility] at [phone] with questions or concerns regarding your invoice.   Our billing staff will not be able to  assist you with questions regarding bills from these companies.  You will be contacted with the lab results as soon as they are available. The fastest way to get your results is to activate your My Chart account. Instructions are located on the last page of this paperwork. If you have not heard from [place] regarding the results in 2 weeks, please contact this office.       I personally performed the services described in this documentation, which was scribed in my presence. The recorded information has been reviewed and considered for accuracy and completeness, addended by me as needed, and agree with information above.  Signed,   [name], MD Primary Care at [facility].  12/23/16 6:27 PM

## 2016-12-23 NOTE — Patient Instructions (Addendum)
Steroid shot given today same as during October visit. Continue your usual pain medications at home, and can discuss with your pain management provider how to dose those during acute flares. Follow-up with neurosurgery as planned to discuss other management options for your back pain as there is a potential to have another flare as you are currently having. If any fevers, or pain is not improving with steroid injection as has improved in past, return for recheck.  If loss of control of urine or stool, numbness in genital area, or weakness of legs - go to emergency room.    Return to the clinic or go to the nearest emergency room if any of your symptoms worsen or new symptoms occur.    Back Pain, Adult Back pain is very common in adults.The cause of back pain is rarely dangerous and the pain often gets better over time.The cause of your back pain may not be known. Some common causes of back pain include:  Strain of the muscles or ligaments supporting the spine.  Wear and tear (degeneration) of the spinal disks.  Arthritis.  Direct injury to the back. For many people, back pain may return. Since back pain is rarely dangerous, most people can learn to manage this condition on their own. Follow these instructions at home: Watch your back pain for any changes. The following actions may help to lessen any discomfort you are feeling:  Remain active. It is stressful on your back to sit or stand in one place for long periods of time. Do not sit, drive, or stand in one place for more than 30 minutes at a time. Take short walks on even surfaces as soon as you are able.Try to increase the length of time you walk each day.  Exercise regularly as directed by your health care provider. Exercise helps your back heal faster. It also helps avoid future injury by keeping your muscles strong and flexible.  Do not stay in bed.Resting more than 1-2 days can delay your recovery.  Pay attention to your body  when you bend and lift. The most comfortable positions are those that put less stress on your recovering back. Always use proper lifting techniques, including:  Bending your knees.  Keeping the load close to your body.  Avoiding twisting.  Find a comfortable position to sleep. Use a firm mattress and lie on your side with your knees slightly bent. If you lie on your back, put a pillow under your knees.  Avoid feeling anxious or stressed.Stress increases muscle tension and can worsen back pain.It is important to recognize when you are anxious or stressed and learn ways to manage it, such as with exercise.  Take medicines only as directed by your health care provider. Over-the-counter medicines to reduce pain and inflammation are often the most helpful.Your health care provider may prescribe muscle relaxant drugs.These medicines help dull your pain so you can more quickly return to your normal activities and healthy exercise.  Apply ice to the injured area:  Put ice in a plastic bag.  Place a towel between your skin and the bag.  Leave the ice on for 20 minutes, 2-3 times a day for the first 2-3 days. After that, ice and heat may be alternated to reduce pain and spasms.  Maintain a healthy weight. Excess weight puts extra stress on your back and makes it difficult to maintain good posture. Contact a health care provider if:  You have pain that is not relieved with rest  or medicine.  You have increasing pain going down into the legs or buttocks.  You have pain that does not improve in one week.  You have night pain.  You lose weight.  You have a fever or chills. Get help right away if:  You develop new bowel or bladder control problems.  You have unusual weakness or numbness in your arms or legs.  You develop nausea or vomiting.  You develop abdominal pain.  You feel faint. This information is not intended to replace advice given to you by your health care provider.  Make sure you discuss any questions you have with your health care provider. Document Released: 11/11/2005 Document Revised: 03/21/2016 Document Reviewed: 03/15/2014 Elsevier Interactive Patient Education  2017 [employer].    IF you received an x-ray today, you will receive an invoice from [facility]. Please contact [facility] at [phone] with questions or concerns regarding your invoice.   IF you received labwork today, you will receive an invoice from [place]. Please contact [facility] at [phone] with questions or concerns regarding your invoice.   Our billing staff will not be able to assist you with questions regarding bills from these companies.  You will be contacted with the lab results as soon as they are available. The fastest way to get your results is to activate your My Chart account. Instructions are located on the last page of this paperwork. If you have not heard from [place] regarding the results in 2 weeks, please contact this office.

## 2016-12-25 ENCOUNTER — Encounter (HOSPITAL_COMMUNITY): Payer: Self-pay

## 2016-12-25 ENCOUNTER — Emergency Department (HOSPITAL_COMMUNITY)
Admission: EM | Admit: 2016-12-25 | Discharge: 2016-12-25 | Disposition: A | Discharge status: Home / Self Care | Payer: Federal, State, Local not specified - PPO | Attending: Emergency Medicine | Admitting: Emergency Medicine

## 2016-12-25 DIAGNOSIS — M545 Low back pain, unspecified: Secondary | ICD-10-CM

## 2016-12-25 DIAGNOSIS — Z79899 Other long term (current) drug therapy: Secondary | ICD-10-CM | POA: Diagnosis not present

## 2016-12-25 DIAGNOSIS — G8929 Other chronic pain: Secondary | ICD-10-CM | POA: Diagnosis not present

## 2016-12-25 DIAGNOSIS — I10 Essential (primary) hypertension: Secondary | ICD-10-CM | POA: Insufficient documentation

## 2016-12-25 MED ORDER — HYDROMORPHONE HCL 2 MG/ML IJ SOLN
1.0000 mg | Freq: Once | INTRAMUSCULAR | Status: AC
  Administered 2016-12-25: 1 mg via INTRAMUSCULAR
  Filled 2016-12-25: qty 1

## 2016-12-25 MED ORDER — OXYCODONE-ACETAMINOPHEN 5-325 MG PO TABS
1.0000 | ORAL_TABLET | Freq: Once | ORAL | Status: AC
  Administered 2016-12-25: 1 via ORAL
  Filled 2016-12-25: qty 1

## 2016-12-25 MED ORDER — METHOCARBAMOL 500 MG PO TABS
500.0000 mg | ORAL_TABLET | Freq: Once | ORAL | Status: AC
  Administered 2016-12-25: 500 mg via ORAL
  Filled 2016-12-25: qty 1

## 2016-12-25 MED ORDER — KETOROLAC TROMETHAMINE 60 MG/2ML IM SOLN
30.0000 mg | Freq: Once | INTRAMUSCULAR | Status: AC
  Administered 2016-12-25: 30 mg via INTRAMUSCULAR
  Filled 2016-12-25: qty 2

## 2016-12-25 NOTE — ED Notes (Signed)
Pt still having pain with movement and unable to bear weight.

## 2016-12-25 NOTE — Discharge Instructions (Signed)
Call your pain management doctor tomorrow for follow up. °

## 2016-12-25 NOTE — ED Provider Notes (Signed)
[facility] DEPT Provider Note   CSN: [id number] Arrival date & time: 12/25/16  1832  By signing my name below, I, Nelwyn SalisburyJoshua Fowler, attest that this documentation has been prepared under the direction and in the presence of non-physician practitioner, Kerrie BuffaloHope Neese, NP. Electronically Signed: Nelwyn SalisburyJoshua Fowler, Scribe. 12/25/2016. 8:37 PM.  History   Chief Complaint Chief Complaint  Patient presents with  . Back Pain   The history is provided by the patient. No language interpreter was used.  Back Pain   This is a chronic problem. The current episode started more than 2 days ago. The problem occurs constantly. The problem has not changed since onset.The pain is associated with no known injury. The pain is present in the lumbar spine. The pain is moderate. The symptoms are aggravated by certain positions. Pertinent negatives include no dysuria and no weakness. She has tried muscle relaxants (Pain medications) for the symptoms. The treatment provided no relief.   HPI Comments:  Samantha Neriamela D [name] is a 46 y.o. female who presents to the Emergency Department complaining of chronic, constant lower back pain which began 20 years ago but has been exacerbating for 3 days. She states that 20 years ago she developed her back pain, and that she gets periodic exacerbations about once every 3 months. She was diagnosed with DDD and is followed by pain management and is compliant with prescribed pain medication and muscle relaxants. Pt reports associated numbness on her right side worsened by movement. She has tried her normal pain medication and flexeril for her current exacerbation with no relief. Pt notes that 2 days ago she was seen by UC and given at cortisone shot, which has not helped her symptoms.  Patient also reports having a recent MRI.    Past Medical History:  Diagnosis Date  . Arthritis   . Contact lens/glasses fitting   . History of swelling of feet   . Hypertension     Patient Active Problem  List   Diagnosis Date Noted  . 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 Back pain 02/10/2012    Past Surgical History:  Procedure Laterality Date  . CESAREAN SECTION  1997  . LAPAROSCOPIC GASTRIC BANDING  02/23/2008    OB History    No data available       Home Medications    Prior to Admission medications   Medication Sig Start Date End Date Taking? Authorizing Provider  cyclobenzaprine (FLEXERIL) 10 MG tablet Take 1 tablet (10 mg total) by mouth 2 (two) times daily as needed for muscle spasms. 09/11/16  Yes Tobey GrimJeffrey H Walden, MD  HYDROcodone-acetaminophen (NORCO/VICODIN) 5-325 MG tablet Take 1 tablet by mouth every 8 (eight) hours as needed. Patient taking differently: Take 1 tablet by mouth every 8 (eight) hours as needed (for pain).  09/11/16  Yes Tobey GrimJeffrey H Walden, MD    Family History Family History  Problem Relation Age of Onset  . Hypertension Mother   . Hypertension Father     Social History Social History  Substance Use Topics  . Smoking status: Never Smoker  . Smokeless tobacco: Never Used  . Alcohol use Yes     Comment: ocassionally     Allergies   Patient has no known allergies.   Review of Systems Review of Systems  Gastrointestinal: Negative for nausea and vomiting.  Genitourinary: Negative for difficulty urinating and dysuria.  Musculoskeletal: Positive for back pain.  Skin: Negative for wound.  Neurological: Negative for weakness.  All other systems reviewed and  are negative.    Physical Exam Updated Vital Signs BP 152/95 (BP Location: Left Arm)   Pulse 79   Temp 98.6 F (37 C) (Oral)   Resp 16   LMP 12/18/2016   SpO2 96%   Physical Exam  Constitutional: She is oriented to person, place, and time. She appears well-developed and well-nourished. No distress.  HENT:  Head: Normocephalic and atraumatic.  Right Ear: Tympanic membrane and ear canal normal.  Left Ear: Tympanic membrane and ear canal normal.  Mouth/Throat: Mucous membranes  are normal.  Eyes: EOM are normal.  Neck: Neck supple.  Cardiovascular: Normal rate, regular rhythm and intact distal pulses.   Pulmonary/Chest: Effort normal and breath sounds normal.  Abdominal: Soft. There is no tenderness.  Musculoskeletal:       Lumbar back: She exhibits decreased range of motion, tenderness and spasm. She exhibits normal pulse.  Radial pulses 2+, adequate circulation.   Neurological: She is alert and oriented to person, place, and time. She displays normal reflexes. No sensory deficit.  Grip equal.  Skin: Skin is warm and dry.  Psychiatric: She has a normal mood and affect. Her behavior is normal.  Nursing note and vitals reviewed.    ED Treatments / Results  DIAGNOSTIC STUDIES:  Oxygen Saturation is 100% on RA, normal by my interpretation.    COORDINATION OF CARE:  8:48 PM Discussed treatment plan with pt at bedside which includes pain management and muscle relaxants and pt agreed to plan.   Radiology No results found.  Procedures Procedures (including critical care time)  Medications Ordered in ED Medications  ketorolac (TORADOL) injection 30 mg (30 mg Intramuscular Given 12/25/16 2107)  oxyCODONE-acetaminophen (PERCOCET/ROXICET) 5-325 MG per tablet 1 tablet (1 tablet Oral Given 12/25/16 2107)  methocarbamol (ROBAXIN) tablet 500 mg (500 mg Oral Given 12/25/16 2107)  HYDROmorphone (DILAUDID) injection 1 mg (1 mg Intramuscular Given 12/25/16 2244)     Initial Impression / Assessment and Plan / ED Course  I have reviewed the triage vital signs and the nursing notes.  Patient with chronic back pain that has improved with treatment in the ED.  No loss of bowel or bladder control.  No concern for cauda equina.  No fever, night sweats, weight loss, h/o cancer, IVDA, no recent procedure to back. No urinary symptoms suggestive of UTI.  Supportive care and f/u with pain management.  Appears safe for discharge at this time. Follow up as indicated in discharge  paperwork.   Final Clinical Impressions(s) / ED Diagnoses   Final diagnoses:  Acute exacerbation of chronic low back pain    New Prescriptions Discharge Medication List as of 12/25/2016 11:40 PM    I personally performed the services described in this documentation, which was scribed in my presence. The recorded information has been reviewed and is accurate.     [place], NP 12/26/16 0127    Laurence Spates, MD 12/26/16 [phone]

## 2016-12-25 NOTE — ED Triage Notes (Signed)
Pt reports lower back pain, hx of chronic back pain "for the past 20 years but this episode just started Sunday." [facility] and received a Cortisone injection on Monday that she reports did not help.

## 2017-01-03 DIAGNOSIS — M9983 Other biomechanical lesions of lumbar region: Secondary | ICD-10-CM | POA: Diagnosis not present

## 2017-01-03 DIAGNOSIS — Z6836 Body mass index (BMI) 36.0-36.9, adult: Secondary | ICD-10-CM | POA: Diagnosis not present

## 2017-01-03 DIAGNOSIS — R030 Elevated blood-pressure reading, without diagnosis of hypertension: Secondary | ICD-10-CM | POA: Diagnosis not present

## 2017-01-16 DIAGNOSIS — K080 Exfoliation of teeth due to systemic causes: Secondary | ICD-10-CM | POA: Diagnosis not present

## 2017-01-20 DIAGNOSIS — M47817 Spondylosis without myelopathy or radiculopathy, lumbosacral region: Secondary | ICD-10-CM | POA: Diagnosis not present

## 2017-01-20 DIAGNOSIS — G4700 Insomnia, unspecified: Secondary | ICD-10-CM | POA: Diagnosis not present

## 2017-01-20 DIAGNOSIS — Z79891 Long term (current) use of opiate analgesic: Secondary | ICD-10-CM | POA: Diagnosis not present

## 2017-01-20 DIAGNOSIS — G894 Chronic pain syndrome: Secondary | ICD-10-CM | POA: Diagnosis not present

## 2017-01-20 DIAGNOSIS — M5417 Radiculopathy, lumbosacral region: Secondary | ICD-10-CM | POA: Diagnosis not present

## 2017-01-27 DIAGNOSIS — K080 Exfoliation of teeth due to systemic causes: Secondary | ICD-10-CM | POA: Diagnosis not present

## 2017-01-28 ENCOUNTER — Encounter: Payer: Federal, State, Local not specified - PPO | Admitting: Physician Assistant

## 2017-01-28 NOTE — Patient Instructions (Signed)
     IF you received an x-ray today, you will receive an invoice from [facility]. Please contact [facility] at [phone] with questions or concerns regarding your invoice.   IF you received labwork today, you will receive an invoice from [facility]. Please contact [facility] at [phone] with questions or concerns regarding your invoice.   Our billing staff will not be able to assist you with questions regarding bills from these companies.  You will be contacted with the lab results as soon as they are available. The fastest way to get your results is to activate your My Chart account. Instructions are located on the last page of this paperwork. If you have not heard from us regarding the results in 2 weeks, please contact this office.     

## 2017-01-30 NOTE — Progress Notes (Signed)
This encounter was created in error - please disregard.

## 2017-02-06 ENCOUNTER — Encounter: Payer: Federal, State, Local not specified - PPO | Admitting: Physician Assistant

## 2017-02-17 DIAGNOSIS — G894 Chronic pain syndrome: Secondary | ICD-10-CM | POA: Diagnosis not present

## 2017-02-17 DIAGNOSIS — G4700 Insomnia, unspecified: Secondary | ICD-10-CM | POA: Diagnosis not present

## 2017-02-17 DIAGNOSIS — M47817 Spondylosis without myelopathy or radiculopathy, lumbosacral region: Secondary | ICD-10-CM | POA: Diagnosis not present

## 2017-02-17 DIAGNOSIS — M5417 Radiculopathy, lumbosacral region: Secondary | ICD-10-CM | POA: Diagnosis not present

## 2017-03-21 DIAGNOSIS — M4807 Spinal stenosis, lumbosacral region: Secondary | ICD-10-CM | POA: Diagnosis not present

## 2017-04-03 DIAGNOSIS — M47817 Spondylosis without myelopathy or radiculopathy, lumbosacral region: Secondary | ICD-10-CM | POA: Diagnosis not present

## 2017-04-03 DIAGNOSIS — G4700 Insomnia, unspecified: Secondary | ICD-10-CM | POA: Diagnosis not present

## 2017-04-03 DIAGNOSIS — M5417 Radiculopathy, lumbosacral region: Secondary | ICD-10-CM | POA: Diagnosis not present

## 2017-04-03 DIAGNOSIS — G894 Chronic pain syndrome: Secondary | ICD-10-CM | POA: Diagnosis not present

## 2017-05-05 DIAGNOSIS — M5417 Radiculopathy, lumbosacral region: Secondary | ICD-10-CM | POA: Diagnosis not present

## 2017-05-05 DIAGNOSIS — G4700 Insomnia, unspecified: Secondary | ICD-10-CM | POA: Diagnosis not present

## 2017-05-05 DIAGNOSIS — M47817 Spondylosis without myelopathy or radiculopathy, lumbosacral region: Secondary | ICD-10-CM | POA: Diagnosis not present

## 2017-05-05 DIAGNOSIS — G894 Chronic pain syndrome: Secondary | ICD-10-CM | POA: Diagnosis not present

## 2017-07-15 DIAGNOSIS — G4700 Insomnia, unspecified: Secondary | ICD-10-CM | POA: Diagnosis not present

## 2017-07-15 DIAGNOSIS — G894 Chronic pain syndrome: Secondary | ICD-10-CM | POA: Diagnosis not present

## 2017-07-15 DIAGNOSIS — M47817 Spondylosis without myelopathy or radiculopathy, lumbosacral region: Secondary | ICD-10-CM | POA: Diagnosis not present

## 2017-07-15 DIAGNOSIS — M5417 Radiculopathy, lumbosacral region: Secondary | ICD-10-CM | POA: Diagnosis not present

## 2017-09-18 ENCOUNTER — Encounter: Payer: Self-pay | Admitting: Family Medicine

## 2017-09-18 DIAGNOSIS — Z1231 Encounter for screening mammogram for malignant neoplasm of breast: Secondary | ICD-10-CM | POA: Diagnosis not present

## 2017-10-05 IMAGING — MR MR LUMBAR SPINE W/O CM
4 of 5 series · 20 of 48 positions shown · non-contrast
Comparison: Prior radiograph from 05/23/2015.

CLINICAL DATA: Evaluation for lower back pain with bilateral feet
pain status post fall 15 years ago.

EXAM:
MRI LUMBAR SPINE WITHOUT CONTRAST
TECHNIQUE: Multiplanar, multisequence MR imaging of the lumbar spine was
performed. No intravenous contrast was administered.

[Series 3: T2 · sagittal · 4.5mm · 0.59mm/px · 6 of 14 slices shown (1 of 2)]
[im 1/14]
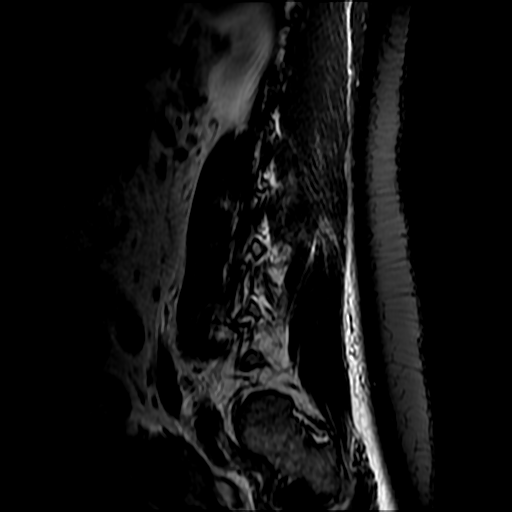
[im 3/14]
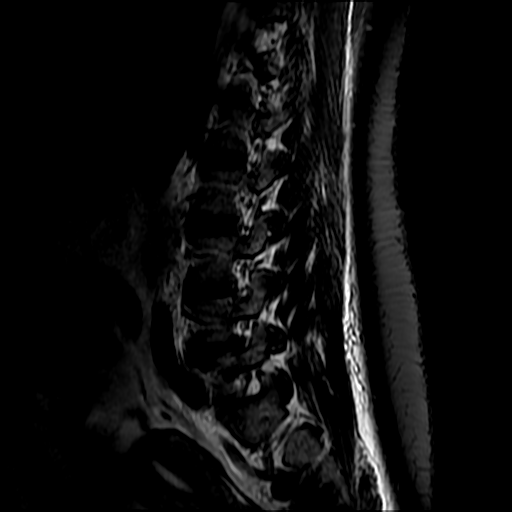
[im 6/14]
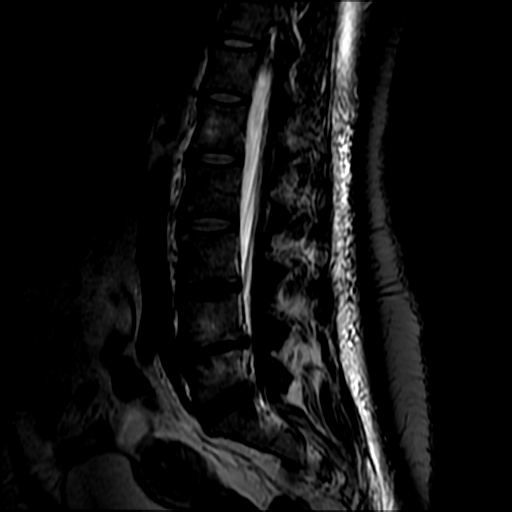
[im 8/14]
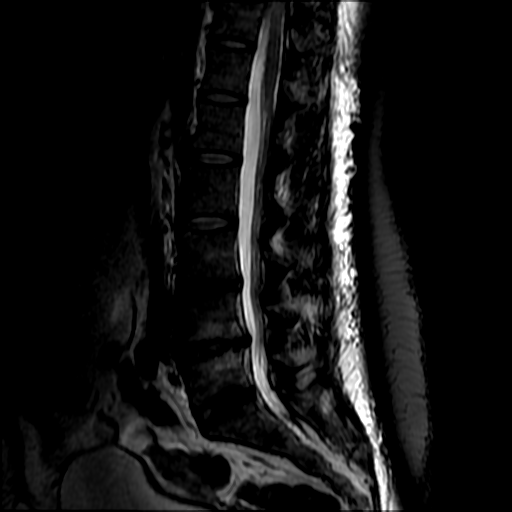
[im 11/14]
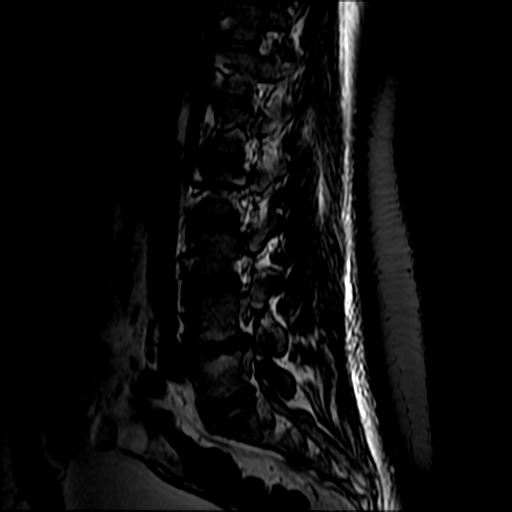
[im 14/14]
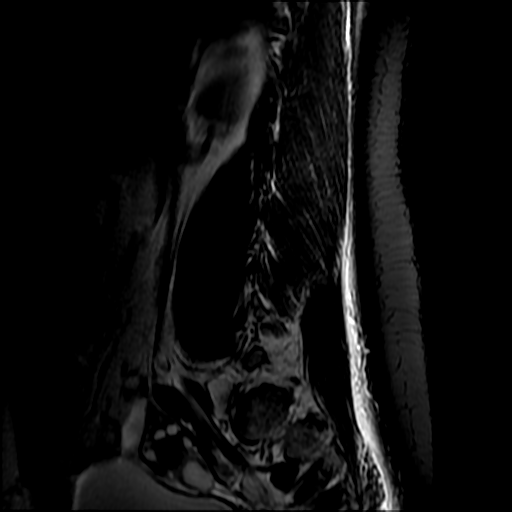

[Series 5: T1 · sagittal · 4.5mm · 0.59mm/px · 3 of 14 slices shown (1 of 2)]
[im 3/14]
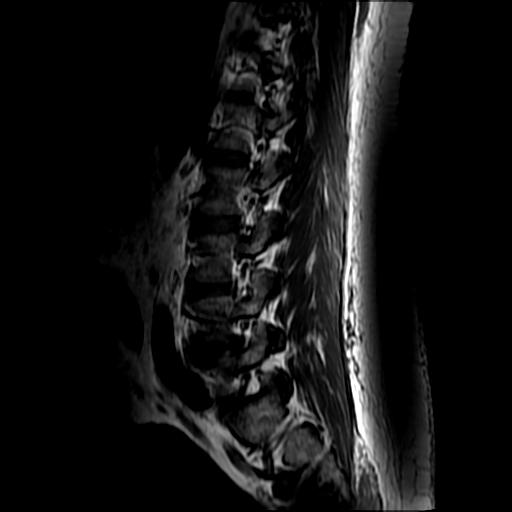
[im 8/14]
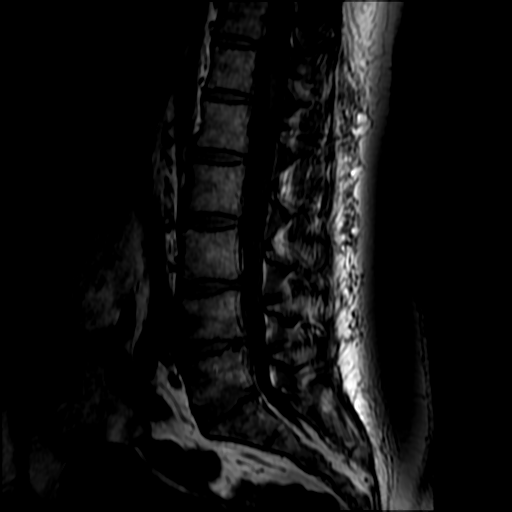
[im 14/14]
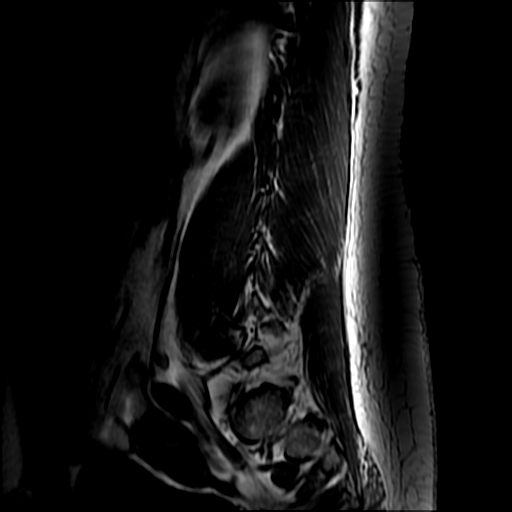

[Series 6: T2 · axial · 4.0mm · 0.43mm/px · z∈[+138,+306]mm · 8 of 35 slices shown (2 of 2)]
[im 1/35]
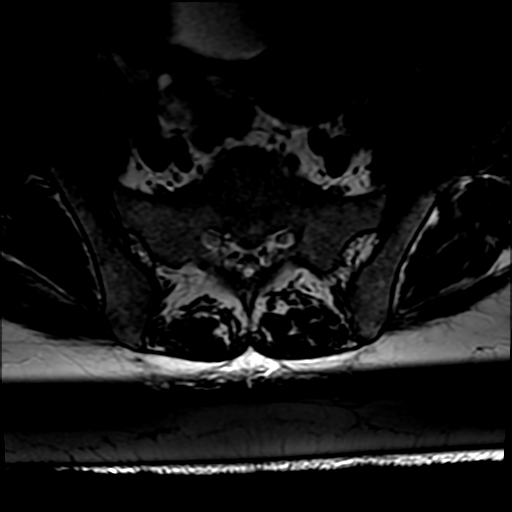
[im 5/35]
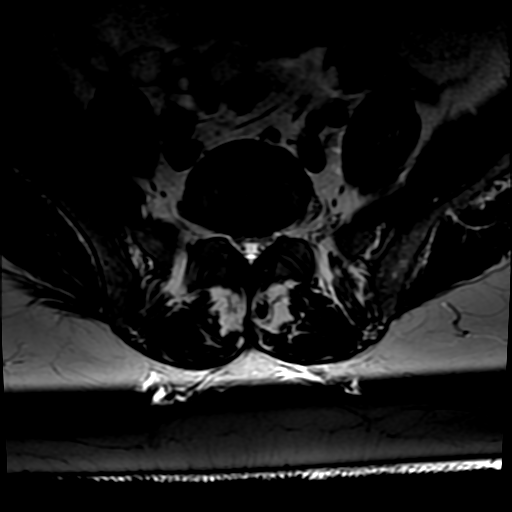
[im 10/35]
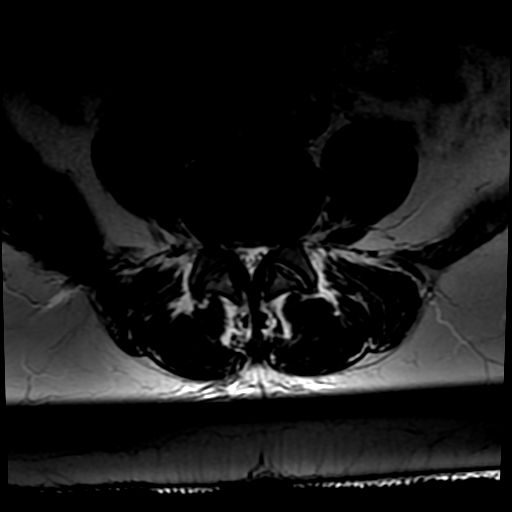
[im 15/35]
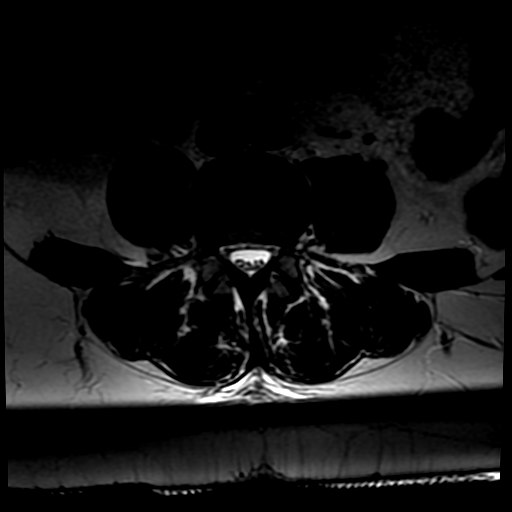
[im 18/35]
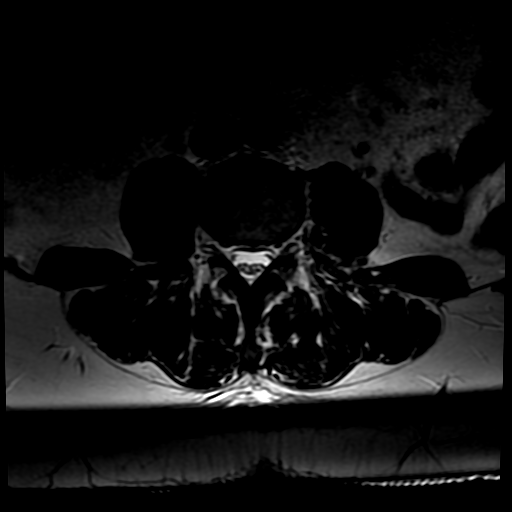
[im 20/35]
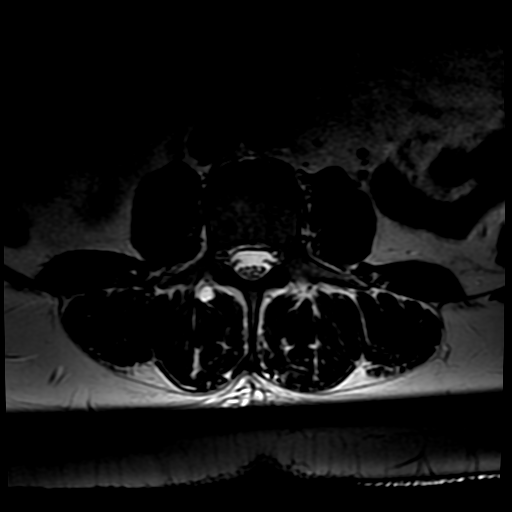
[im 25/35]
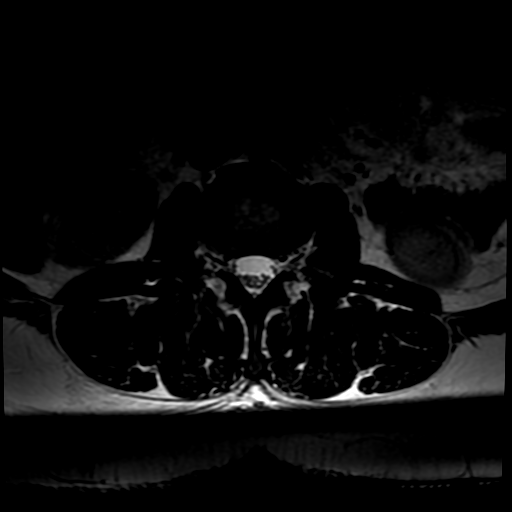
[im 30/35]
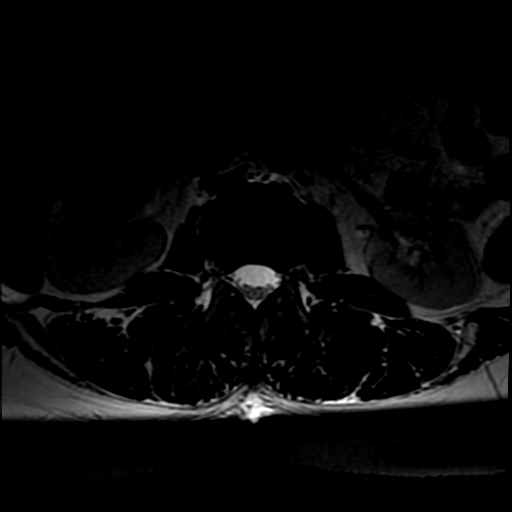

[Series 7: T1 · axial · 4.0mm · 0.86mm/px · z∈[+157,+306]mm · 3 of 35 slices shown (2 of 2)]
[im 5/35]
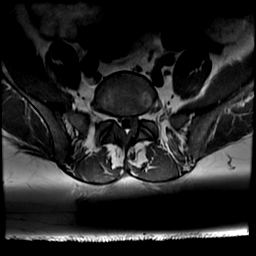
[im 18/35]
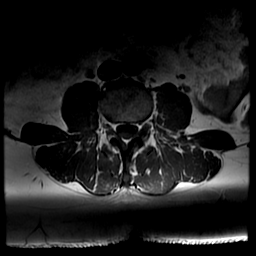
[im 30/35]
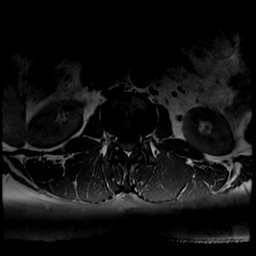

[20 of 48 positions shown; findings below may reference images not displayed]

FINDINGS: Segmentation: Normal segmentation. Lowest well-formed disc is
labeled the L5-S1 level.

Alignment: Vertebral bodies are normally aligned with preservation
of the normal lumbar lordosis. No listhesis.

Vertebrae: Vertebral body heights are well preserved. No evidence
for acute or chronic fracture. Reactive endplate changes present
about the L4-5 interspace. Signal intensity within the vertebral
body bone marrow is otherwise unremarkable. No worrisome osseous
lesions.

Conus medullaris: Extends to the L1 level and appears normal.

Paraspinal and other soft tissues: Paraspinous soft tissues within
normal limits. Cholelithiasis noted. Remainder the visualized
visceral structures are up grossly unremarkable.

Disc levels:

L1-2:  Negative.

L2-3: Negative.

L3-4: Diffuse degenerative disc bulge with disc desiccation.
Superimposed central disc protrusion mildly indents the ventral
thecal sac (series 6, image 19). Associated annular fissure noted.
No significant canal or subarticular stenosis. Foramina remain
widely patent. The

L4-5: Diffuse degenerative disc bulge with disc desiccation and
intervertebral disc space narrowing. Associated reactive endplate
changes. Superimposed central disc protrusion is present, slightly
eccentric to the right. Superimposed mild facet arthrosis with
ligamentum flavum hypertrophy. There is resultant mild to moderate
canal with mild subarticular stenosis bilaterally. No significant
foraminal encroachment.

L5-S1: Degenerative disc desiccation with mild annular disc bulge.
Superimposed ligamentum flavum thickening. No significant canal or
foraminal stenosis.
IMPRESSION: 1. Central disc protrusion at L4-5 with superimposed mild facet
disease, resulting in mild to moderate canal and subarticular
stenosis.
2. Small central disc protrusion at L3-4 without significant
stenosis.
3. Mild degenerative disc bulge at L5-S1 without stenosis.
4. Cholelithiasis.

## 2018-03-24 DIAGNOSIS — K080 Exfoliation of teeth due to systemic causes: Secondary | ICD-10-CM | POA: Diagnosis not present

## 2018-08-03 ENCOUNTER — Ambulatory Visit: Payer: Federal, State, Local not specified - PPO | Admitting: Physician Assistant

## 2018-08-03 VITALS — BP 147/92 | HR 80 | Temp 98.0°F | Resp 16 | Ht 69.0 in | Wt 242.0 lb

## 2018-08-03 DIAGNOSIS — Z23 Encounter for immunization: Secondary | ICD-10-CM | POA: Diagnosis not present

## 2018-08-03 DIAGNOSIS — R300 Dysuria: Secondary | ICD-10-CM

## 2018-08-03 DIAGNOSIS — N3001 Acute cystitis with hematuria: Secondary | ICD-10-CM

## 2018-08-03 LAB — POC MICROSCOPIC URINALYSIS (UMFC): Mucus: ABSENT

## 2018-08-03 LAB — POCT URINALYSIS DIP (MANUAL ENTRY)
Bilirubin, UA: NEGATIVE
Glucose, UA: NEGATIVE mg/dL
Ketones, POC UA: NEGATIVE mg/dL
Nitrite, UA: POSITIVE — AB
Protein Ur, POC: NEGATIVE mg/dL
Spec Grav, UA: 1.015 (ref 1.010–1.025)
Urobilinogen, UA: 0.2 E.U./dL
pH, UA: 5.5 (ref 5.0–8.0)

## 2018-08-03 MED ORDER — NITROFURANTOIN MONOHYD MACRO 100 MG PO CAPS: 100.0000 mg | ORAL_CAPSULE | Freq: Two times a day (BID) | ORAL | 0 refills | Status: AC

## 2018-08-03 NOTE — Progress Notes (Signed)
Samantha [name]  MRN: [id number] DOB: 05/06/1971  PCP: Shade Flood, MD  Subjective:   Patient complains of abnormal smelling urine, dysuria, frequency, suprapubic pressure and urgency. She has had symptoms for 3 days. Patient also complains of none. Patient denies back pain, cough, fever and vaginal discharge. Patient does not have a history of recurrent UTI. Patient does not have a history of pyelonephritis.     Review of Systems  Constitutional: Negative for chills, fatigue and fever.  Respiratory: Negative for cough, shortness of breath and wheezing.   Cardiovascular: Negative for chest pain and palpitations.  Gastrointestinal: Negative for abdominal pain, diarrhea, nausea and vomiting.  Genitourinary: Positive for dysuria. Negative for decreased urine volume, difficulty urinating, enuresis, flank pain, frequency, hematuria and urgency.  Musculoskeletal: Negative for back pain.    Patient Active Problem List   Diagnosis Date Noted  . 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 Back pain 02/10/2012    Current Outpatient Medications on File Prior to Visit  Medication Sig Dispense Refill  . cyclobenzaprine (FLEXERIL) 10 MG tablet Take 1 tablet (10 mg total) by mouth 2 (two) times daily as needed for muscle spasms. 30 tablet 0  . HYDROcodone-acetaminophen (NORCO/VICODIN) 5-325 MG tablet Take 1 tablet by mouth every 8 (eight) hours as needed. (Patient taking differently: Take 1 tablet by mouth every 8 (eight) hours as needed (for pain). ) 20 tablet 0   No current facility-administered medications on file prior to visit.     No Known Allergies   Objective:  There were no vitals taken for this visit.  Physical Exam  Constitutional: She is oriented to person, place, and time. She appears well-developed and well-nourished.  Abdominal: Normal appearance and bowel sounds are normal. There is no tenderness. There is no CVA tenderness.  Neurological: She is alert and oriented to  person, place, and time.  Skin: Skin is warm and dry.  Psychiatric: She has a normal mood and affect. Her behavior is normal. Judgment and thought content normal.    Results for orders placed or performed in visit on 08/03/18  POCT Microscopic Urinalysis ([facility])  Result Value Ref Range   WBC,UR,HPF,POC Too numerous to count  (A) None WBC/hpf   RBC,UR,HPF,POC Moderate (A) None RBC/hpf   Bacteria Many (A) None, Too numerous to count   Mucus Absent Absent   Epithelial Cells, UR Per Microscopy Few (A) None, Too numerous to count cells/hpf  POCT urinalysis dipstick  Result Value Ref Range   Color, UA yellow yellow   Clarity, UA cloudy (A) clear   Glucose, UA negative negative mg/dL   Bilirubin, UA negative negative   Ketones, POC UA negative negative mg/dL   Spec Grav, UA 4.098 1.191 - 1.025   Blood, UA moderate (A) negative   pH, UA 5.5 5.0 - 8.0   Protein Ur, POC negative negative mg/dL   Urobilinogen, UA 0.2 0.2 or 1.0 E.U./dL   Nitrite, UA Positive (A) Negative   Leukocytes, UA Small (1+) (A) Negative    Assessment and Plan :  1. Acute cystitis with hematuria -Patient presents complaining of dysuria, frequency, lower abdominal pressure.  UA is indicative of urinary tract infection.  No concern for pyelonephritis.  No red flags.  Vital signs are stable.  Plan to treat for simple cystitis with Macrobid for 1 week.  Return to clinic if no improvement of symptoms. - nitrofurantoin, macrocrystal-monohydrate, (MACROBID) 100 MG capsule; Take 1 capsule (100 mg total) by mouth 2 (two) times  daily for 7 days.  Dispense: 14 capsule; Refill: 0  2. Dysuria - POCT Microscopic Urinalysis ([facility]) - POCT urinalysis dipstick - Urine Culture  3. Need for influenza vaccination - Flu Vaccine QUAD 36+ mos IM    Marco Collie, PA-C  [facility] at [facility] 08/03/2018 1:31 PM  Please note: Portions of this report may have been transcribed using dragon voice recognition  software. Every effort was made to ensure accuracy; however, inadvertent computerized transcription errors may be present.

## 2018-08-03 NOTE — Patient Instructions (Addendum)
  Start taking Macrobid twice daily for one week. Finish the entire course of the medication, even if you start to feel better sooner.  Come back if your symptoms fail to improve.  Urinary Tract Infection, Adult A urinary tract infection (UTI) is an infection of any part of the urinary tract. The urinary tract includes the:  Kidneys.  Ureters.  Bladder.  Urethra.  These organs make, store, and get rid of pee (urine) in the body. Follow these instructions at home:  Take over-the-counter and prescription medicines only as told by your doctor.  If you were prescribed an antibiotic medicine, take it as told by your doctor. Do not stop taking the antibiotic even if you start to feel better.  Avoid the following drinks: ? Alcohol. ? Caffeine. ? Tea. ? Carbonated drinks.  Drink enough fluid to keep your pee clear or pale yellow.  Keep all follow-up visits as told by your doctor. This is important.  Make sure to: ? Empty your bladder often and completely. Do not to hold pee for long periods of time. ? Empty your bladder before and after sex. ? Wipe from front to back after a bowel movement if you are female. Use each tissue one time when you wipe. Contact a doctor if:  You have back pain.  You have a fever.  You feel sick to your stomach (nauseous).  You throw up (vomit).  Your symptoms do not get better after 3 days.  Your symptoms go away and then come back. Get help right away if:  You have very bad back pain.  You have very bad lower belly (abdominal) pain.  You are throwing up and cannot keep down any medicines or water. This information is not intended to replace advice given to you by your health care provider. Make sure you discuss any questions you have with your health care provider. Document Released: 04/29/2008 Document Revised: 04/18/2016 Document Reviewed: 10/02/2015 Elsevier Interactive Patient Education  [employer].   If you have lab work  done today you will be contacted with your lab results within the next 2 weeks.  If you have not heard from [place] then please contact us. The fastest way to get your results is to register for My Chart.   IF you received an x-ray today, you will receive an invoice from [facility]. Please contact [facility] at [phone] with questions or concerns regarding your invoice.   IF you received labwork today, you will receive an invoice from [facility]. Please contact [facility] at [phone] with questions or concerns regarding your invoice.   Our billing staff will not be able to assist you with questions regarding bills from these companies.  You will be contacted with the lab results as soon as they are available. The fastest way to get your results is to activate your My Chart account. Instructions are located on the last page of this paperwork. If you have not heard from [place] regarding the results in 2 weeks, please contact this office.

## 2018-08-04 ENCOUNTER — Ambulatory Visit: Payer: Federal, State, Local not specified - PPO | Admitting: Physician Assistant

## 2018-08-05 LAB — URINE CULTURE

## 2018-09-22 ENCOUNTER — Encounter: Payer: Self-pay | Admitting: Family Medicine

## 2018-09-22 DIAGNOSIS — Z1231 Encounter for screening mammogram for malignant neoplasm of breast: Secondary | ICD-10-CM | POA: Diagnosis not present

## 2019-10-11 LAB — HM MAMMOGRAPHY

## 2019-12-15 ENCOUNTER — Encounter: Payer: Self-pay | Admitting: Family Medicine

## 2020-02-09 ENCOUNTER — Other Ambulatory Visit: Payer: Self-pay | Admitting: Otolaryngology

## 2020-02-09 DIAGNOSIS — E041 Nontoxic single thyroid nodule: Secondary | ICD-10-CM

## 2020-02-24 ENCOUNTER — Ambulatory Visit
Admission: RE | Admit: 2020-02-24 | Discharge: 2020-02-24 | Disposition: A | Discharge status: Home / Self Care | Payer: Federal, State, Local not specified - PPO | Source: Ambulatory Visit | Attending: Otolaryngology | Admitting: Otolaryngology

## 2020-02-24 DIAGNOSIS — E041 Nontoxic single thyroid nodule: Secondary | ICD-10-CM

## 2020-05-22 ENCOUNTER — Other Ambulatory Visit (HOSPITAL_COMMUNITY): Payer: Self-pay | Admitting: Surgery

## 2020-05-22 ENCOUNTER — Other Ambulatory Visit: Payer: Self-pay | Admitting: Surgery

## 2020-05-22 DIAGNOSIS — K9509 Other complications of gastric band procedure: Secondary | ICD-10-CM

## 2020-05-31 ENCOUNTER — Ambulatory Visit (HOSPITAL_COMMUNITY)
Admission: RE | Admit: 2020-05-31 | Discharge: 2020-05-31 | Disposition: A | Discharge status: Home / Self Care | Payer: No Typology Code available for payment source | Source: Ambulatory Visit | Attending: Surgery | Admitting: Surgery

## 2020-05-31 ENCOUNTER — Other Ambulatory Visit: Payer: Self-pay

## 2020-05-31 DIAGNOSIS — K9509 Other complications of gastric band procedure: Secondary | ICD-10-CM | POA: Diagnosis not present

## 2020-07-21 ENCOUNTER — Ambulatory Visit
Admission: EM | Admit: 2020-07-21 | Discharge: 2020-07-21 | Disposition: A | Discharge status: Home / Self Care | Payer: No Typology Code available for payment source

## 2020-07-21 DIAGNOSIS — M79604 Pain in right leg: Secondary | ICD-10-CM | POA: Diagnosis not present

## 2020-07-21 DIAGNOSIS — M7989 Other specified soft tissue disorders: Secondary | ICD-10-CM

## 2020-07-21 NOTE — ED Triage Notes (Signed)
Pt presents with complaints of calf pain in her right calf x 3 days. Reports she feels like her toe nails look darker. Concerned for DVT. [place] hall potvin pa notified.

## 2020-07-21 NOTE — ED Provider Notes (Signed)
[facility]    CSN: [id number] Arrival date & time: 07/21/20  1804      History   Chief Complaint Chief Complaint  Patient presents with  . Leg Pain    HPI Samantha [name] is a 49 y.o. female with history of hypertension presenting for right calf pain x3 days.  Patient feels like her toes appear more cyanotic than normal.  States that she wore blue shoes and was not sure if this staying them.  States she washed it with no improvement.  Endorsing claudication.  No history of injury, recent surgery, history of malignancy or hormone therapy.  No history of DVT, prolonged stasis.  Has tried ibuprofen without relief.    Past Medical History:  Diagnosis Date  . Arthritis   . Contact lens/glasses fitting   . History of swelling of feet   . Hypertension     Patient Active Problem List   Diagnosis Date Noted  . 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 Back pain 02/10/2012    Past Surgical History:  Procedure Laterality Date  . CESAREAN SECTION  1997  . LAPAROSCOPIC GASTRIC BANDING  02/23/2008    OB History   No obstetric history on file.      Home Medications    Prior to Admission medications   Medication Sig Start Date End Date Taking? Authorizing Provider  lisinopril (ZESTRIL) 10 MG tablet Take 10 mg by mouth daily.   Yes [provider]    Family History Family History  Problem Relation Age of Onset  . Hypertension Mother   . Hypertension Father     Social History Social History   Tobacco Use  . Smoking status: Never Smoker  . Smokeless tobacco: Never Used  Substance Use Topics  . Alcohol use: Yes    Comment: ocassionally  . Drug use: No     Allergies   Patient has no known allergies.   Review of Systems As per HPI   Physical Exam Triage Vital Signs ED Triage Vitals  Enc Vitals Group     BP 07/21/20 1919 (!) 152/101     Pulse Rate 07/21/20 1919 72     Resp 07/21/20 1919 16     Temp 07/21/20 1919 98.7 F (37.1 C)      Temp Source 07/21/20 1919 Oral     SpO2 07/21/20 1919 96 %     Weight --      Height --      Head Circumference --      Peak Flow --      Pain Score 07/21/20 1922 5     Pain Loc --      Pain Edu? --      Excl. in GC? --    No data found.  Updated Vital Signs BP (!) 152/101 (BP Location: Left Arm)   Pulse 72   Temp 98.7 F (37.1 C) (Oral)   Resp 16   SpO2 96%   Visual Acuity Right Eye Distance:   Left Eye Distance:   Bilateral Distance:    Right Eye Near:   Left Eye Near:    Bilateral Near:     Physical Exam Constitutional:      General: She is not in acute distress. HENT:     Head: Normocephalic and atraumatic.  Eyes:     General: No scleral icterus.    Pupils: Pupils are equal, round, and reactive to light.  Cardiovascular:     Rate and Rhythm:  Normal rate and regular rhythm.     Pulses: Normal pulses.     Heart sounds: Normal heart sounds.  Pulmonary:     Effort: Pulmonary effort is normal. No respiratory distress.     Breath sounds: No wheezing.  Musculoskeletal:        General: Normal range of motion.     Right lower leg: Edema present.     Left lower leg: Edema present.     Comments: Bilateral nonpitting edema of distal lower extremities.  No bony tenderness.  Right calf without cords appreciated.  Mild calf tenderness.  Negative Homans' sign.  NVI  Skin:    Capillary Refill: Capillary refill takes less than 2 seconds.     Coloration: Skin is not jaundiced or pale.     Findings: No bruising, erythema or rash.  Neurological:     Mental Status: She is alert and oriented to person, place, and time.      UC Treatments / Results  Labs (all labs ordered are listed, but only abnormal results are displayed) Labs Reviewed - No data to display  EKG   Radiology No results found.  Procedures Procedures (including critical care time)  Medications Ordered in UC Medications - No data to display  Initial Impression / Assessment and Plan / UC Course    I have reviewed the triage vital signs and the nursing notes.  Pertinent labs & imaging results that were available during my care of the patient were reviewed by me and considered in my medical decision making (see chart for details).     Patient concerned for blood clot: Discussed lower concern given patient's history and reassuring exam today, lack of inciting event.  Will order outpatient ultrasound study to be completed at hospital tomorrow.  Return precautions discussed, pt verbalized understanding and is agreeable to plan. Final Clinical Impressions(s) / UC Diagnoses   Final diagnoses:  Right leg pain  Right leg swelling     Discharge Instructions     Go to [facility] ER check-in desk. You are NOT there to check into ER - you are there for an OUTPATIENT ULTRASOUND STUDY TO RULE OUT A BLOOD CLOT.    ED Prescriptions    None     PDMP not reviewed this encounter.   Hall[place]Potvin, [place], [place] 07/22/20 [phone]

## 2020-07-21 NOTE — Discharge Instructions (Addendum)
Go to [facility] ER check-in desk. You are NOT there to check into ER - you are there for an OUTPATIENT ULTRASOUND STUDY TO RULE OUT A BLOOD CLOT.

## 2020-07-22 ENCOUNTER — Telehealth: Payer: Self-pay | Admitting: Emergency Medicine

## 2020-07-22 ENCOUNTER — Ambulatory Visit (HOSPITAL_COMMUNITY)
Admission: RE | Admit: 2020-07-22 | Discharge: 2020-07-22 | Disposition: A | Discharge status: Home / Self Care | Payer: No Typology Code available for payment source | Source: Ambulatory Visit | Attending: Physician Assistant | Admitting: Physician Assistant

## 2020-07-22 ENCOUNTER — Encounter: Payer: Self-pay | Admitting: Emergency Medicine

## 2020-07-22 DIAGNOSIS — R609 Edema, unspecified: Secondary | ICD-10-CM | POA: Diagnosis not present

## 2020-07-22 DIAGNOSIS — R600 Localized edema: Secondary | ICD-10-CM | POA: Insufficient documentation

## 2020-07-22 NOTE — Progress Notes (Signed)
VASCULAR LAB   Right lower extremity venous duplex completed.    Preliminary report:  See CV proc for preliminary results.  Attempted to call results X at 11:00.  No answer at either extension.  Elantra Caprara, RVT 07/22/2020, 11:00 AM

## 2020-07-22 NOTE — Telephone Encounter (Signed)
Doppler re-ordered due to system error.  confirmed receipt w/ [place] tech

## 2020-07-27 ENCOUNTER — Other Ambulatory Visit: Payer: Self-pay

## 2020-07-27 ENCOUNTER — Encounter: Payer: No Typology Code available for payment source | Attending: Surgery | Admitting: Skilled Nursing Facility1

## 2020-07-27 DIAGNOSIS — E669 Obesity, unspecified: Secondary | ICD-10-CM

## 2020-07-27 NOTE — Progress Notes (Signed)
Nutrition Assessment for Bariatric Surgery Medical Nutrition Therapy Appt Start Time: 10:53   End Time: 11:03  Patient was seen on 07/27/2020 for Pre-Operative Nutrition Assessment. Letter of approval faxed to [facility] bariatric surgery program coordinator on 07/27/2020  Referral stated Supervised Weight Loss (SWL) visits needed: 0  Planned surgery: Conversion to Sleeve From Gastric Band Pt expectation of surgery: to lose weight Pt expectation of dietitian: none stated   NUTRITION ASSESSMENT   Anthropometrics  Start weight at [facility]: 267lbs (date: 07/27/2020)  Height: 70.5 in BMI: 38.86 kg/m2     Clinical  Medical hx: HTN Medications:  Labs: Notable signs/symptoms: back and leg pain, constipation  Any previous deficiencies? No  Micronutrient Nutrition Focused Physical Exam: Hair: No issues observed Eyes: No issues observed Mouth: No issues observed Neck: No issues observed Nails: No issues observed Skin: No issues observed  Lifestyle & Dietary Hx  Pt states she has trouble processing speech so she needs provider to speak slowly (NOT louder).  Pt states certain foods do not allow her to swallow feeling like she chokes on it leading her to throw up stating she is unsure if it is due to the lapband or not (most likely).    Pt states her boyfriend is very supportive.  Pt state she really likes to use the instantpot. Pt states she works from home.   24-Hr Dietary Recall First Meal: skipped Snack: 11am 3 bacon + eggs + cheese Second Meal: fruit cup or yogurt Snack:  Third Meal: fast food or chicken in instant pot Snack:  Beverages: water, diet soda   Estimated Energy Needs Calories: 1600   NUTRITION DIAGNOSIS  Overweight/obesity ([place]-3.3) related to past poor dietary habits and physical inactivity as evidenced by patient w/ planned sleeve from gastric band surgery following dietary guidelines for continued weight loss.    NUTRITION INTERVENTION   Nutrition counseling (C-1) and education (E-2) to facilitate bariatric surgery goals.   Pre-Op Goals Reviewed with the Patient . Track food and beverage intake (pen and paper, MyFitness Pal, Baritastic app, etc.) . Make healthy food choices while monitoring portion sizes . Consume 3 meals per day or try to eat every 3-5 hours . Avoid concentrated sugars and fried foods . Keep sugar & fat in the single digits per serving on food labels . Practice CHEWING your food (aim for applesauce consistency) . Practice not drinking 15 minutes before, during, and 30 minutes after each meal and snack . Avoid all carbonated beverages (ex: soda, sparkling beverages)  . Limit caffeinated beverages (ex: coffee, tea, energy drinks) . Avoid all sugar-sweetened beverages (ex: regular soda, sports drinks)  . Avoid alcohol  . Aim for 64-100 ounces of FLUID daily (with at least half of fluid intake being plain water)  . Aim for at least 60-80 grams of PROTEIN daily . Look for a liquid protein source that contains ?15 g protein and ?5 g carbohydrate (ex: shakes, drinks, shots) . Make a list of non-food related activities . Physical activity is an important part of a healthy lifestyle so keep it moving! The goal is to reach 150 minutes of exercise per week, including cardiovascular and weight baring activity.  *Goals that are bolded indicate the pt would like to start working towards these  Handouts Provided Include  . Bariatric Surgery handouts (Nutrition Visits, Pre-Op Goals, Protein Shakes, Vitamins & Minerals)  Learning Style & Readiness for Change Teaching method utilized: Visual & Auditory  Demonstrated degree of understanding via: Teach Back  .  Barriers to learning/adherence to lifestyle change: none identified      MONITORING & EVALUATION Dietary intake, weekly physical activity, body weight, and pre-op goals reached at next nutrition visit.    Next Steps  Patient is to follow up at [facility] for Pre-Op  Class >2 weeks before surgery for further nutrition education.

## 2020-08-03 ENCOUNTER — Ambulatory Visit: Payer: No Typology Code available for payment source | Admitting: Dietician

## 2020-08-09 ENCOUNTER — Ambulatory Visit: Payer: No Typology Code available for payment source | Admitting: Skilled Nursing Facility1

## 2020-09-25 ENCOUNTER — Encounter: Payer: No Typology Code available for payment source | Attending: Surgery | Admitting: Skilled Nursing Facility1

## 2020-09-25 ENCOUNTER — Other Ambulatory Visit: Payer: Self-pay

## 2020-09-25 DIAGNOSIS — E669 Obesity, unspecified: Secondary | ICD-10-CM | POA: Diagnosis present

## 2020-09-25 NOTE — Progress Notes (Signed)
Pre-Operative Nutrition Class:  Appt start time: 7207   End time:  1830.  Patient was seen on 09/25/2020 for Pre-Operative Bariatric Surgery Education [facility]    Surgery date: 10/16/2020 Surgery type:sleeve from band Start weight at [facility]: 267 Weight today: 270.1   The following the learning objectives were met by the patient during this course:  Identify Pre-Op Dietary Goals and will begin 2 weeks pre-operatively  Identify appropriate sources of fluids and proteins   State protein recommendations and appropriate sources pre and post-operatively  Identify Post-Operative Dietary Goals and will follow for 2 weeks post-operatively  Identify appropriate multivitamin and calcium sources  Describe the need for physical activity post-operatively and will follow MD recommendations  State when to call healthcare provider regarding medication questions or post-operative complications  Handouts given during class include:  Pre-Op Bariatric Surgery Diet Handout  Protein Shake Handout  Post-Op Bariatric Surgery Nutrition Handout  [facility] Program Information Flyer  Support Group Information Flyer  [facility] Bariatric Supplements Price List  Follow-Up Plan: Patient will follow-up at [facility] 2 weeks post operatively for diet advancement per MD.

## 2020-09-28 NOTE — H&P (Signed)
Chief Complaint:  lapband dysfunction  History of Present Illness:  Samantha [name] is an 49 y.o. female who had a Lapband APS placed in March 2009.  Port secured to fascia according to my note.   Her preoperative weight was 296 and her lowest weight was 215 in April 2013. She has been noticing more problems with food regurgitation  and having to forcefully vomit. Further time she is able to eat. She's tried to stay on her diet but has developed some maladpative eating. Marland Kitchen Her weight has stayed essentially the same since 2017.   Past Medical History:  Diagnosis Date  . Arthritis   . Contact lens/glasses fitting   . History of swelling of feet   . Hypertension     Past Surgical History:  Procedure Laterality Date  . CESAREAN SECTION  1997  . LAPAROSCOPIC GASTRIC BANDING  02/23/2008    No current facility-administered medications for this encounter.   Current Outpatient Medications  Medication Sig Dispense Refill  . DEPO-SUBQ PROVERA 104 104 MG/0.65ML injection Inject 104 mg into the skin every 3 (three) months.    Marland Kitchen lisinopril-hydrochlorothiazide (ZESTORETIC) 20-12.5 MG tablet Take 1 tablet by mouth daily.    . naproxen (NAPROSYN) 500 MG tablet Take 500 mg by mouth 2 (two) times daily as needed (pain.).      Patient has no known allergies. Family History  Problem Relation Age of Onset  . Hypertension Mother   . Hypertension Father    Social History:   reports that she has never smoked. She has never used smokeless tobacco. She reports current alcohol use. She reports that she does not use drugs.   REVIEW OF SYSTEMS : Negative except for hearing loss requiring hearing aids  Physical Exam:   WEight 272, Ht 70 ".  BMI 39  Gen:  WDWN AAF NAD  Neurological: Alert and oriented to person, place, and time. Motor and sensory function except hearing is grossly intact  Head: Normocephalic and atraumatic.  Eyes: Conjunctivae are normal. Pupils are equal, round, and reactive to  light. No scleral icterus.  Neck: Normal range of motion. Neck supple. No tracheal deviation or thyromegaly present.  Cardiovascular:  SR without murmurs or gallops.  No carotid bruits Breast:  Not examined Respiratory: Effort normal.  No respiratory distress. No chest wall tenderness. Breath sounds normal.  No wheezes, rales or rhonchi.  Abdomen:  Port was difficult to access on the right side GU:  Not examined Musculoskeletal: Normal range of motion. Extremities are nontender. No cyanosis, edema or clubbing noted Lymphadenopathy: No cervical, preauricular, postauricular or axillary adenopathy is present Skin: Skin is warm and dry. No rash noted. No diaphoresis. No erythema. No pallor. Pscyh: Normal mood and affect. Behavior is normal. Judgment and thought content normal.   LABORATORY RESULTS: No results found for this or any previous visit (from the past 48 hour(s)).   RADIOLOGY RESULTS: No results found.  Problem List: Patient Active Problem List   Diagnosis Date Noted  . 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 Back pain 02/10/2012    Assessment & Plan: lapband dysfunction with maladaptive eating.  For removal of lapband and conversion to sleeve gastrectomy.      Matt B. Daphine Deutscher, MD, [facility] [phone] beeper [phone]  09/28/2020 9:36 AM

## 2020-10-03 NOTE — Progress Notes (Signed)
DUE TO COVID-19 ONLY ONE VISITOR IS ALLOWED TO COME WITH YOU AND STAY IN THE WAITING ROOM ONLY DURING PRE OP AND PROCEDURE DAY OF SURGERY. THE 1 VISITOR  MAY VISIT WITH YOU AFTER SURGERY IN YOUR PRIVATE ROOM DURING VISITING HOURS ONLY!  YOU NEED TO HAVE A COVID 19 TEST ON__11/18/2021 _____ @_______ , THIS TEST MUST BE DONE BEFORE SURGERY,  COVID TESTING SITE [address] [place] , IT IS ON THE RIGHT GOING OUT [address] APPROXIMATELY  2 MINUTES PAST [employer] ON THE RIGHT. ONCE YOUR COVID TEST IS COMPLETED,  PLEASE BEGIN THE QUARANTINE INSTRUCTIONS AS OUTLINED IN YOUR HANDOUT.                [name]  10/03/2020   Your procedure is scheduled on: 10/16/2020    Report to [facility] Main  Entrance   Report to admitting at    0515 AM     Call this number if you have problems the morning of surgery [phone]    REMEMBER: NO  SOLID FOOD CANDY OR GUM AFTER MIDNIGHT. CLEAR LIQUIDS UNTIL   0415am        . NOTHING BY MOUTH EXCEPT CLEAR LIQUIDS UNTIL    . PLEASE FINISH ENSURE DRINK PER SURGEON ORDER  WHICH NEEDS TO BE COMPLETED 0415am .      CLEAR LIQUID DIET   Foods Allowed                                                                    Coffee and tea, regular and decaf                            Fruit ices (not with fruit pulp)                                      Iced Popsicles                                    Carbonated beverages, regular and diet                                    Cranberry, grape and apple juices Sports drinks like Gatorade Lightly seasoned clear broth or consume(fat free) Sugar, honey syrup ___________________________________________________________________      BRUSH YOUR TEETH MORNING OF SURGERY AND RINSE YOUR MOUTH OUT, NO CHEWING GUM CANDY OR MINTS.     Take these medicines the morning of surgery with A SIP OF WATER: none   DO NOT TAKE ANY DIABETIC MEDICATIONS DAY OF YOUR SURGERY                                You may not have any metal on your body including hair pins and              piercings  Do not wear jewelry, make-up, lotions, powders  or perfumes, deodorant             Do not wear nail polish on your fingernails.  Do not shave  48 hours prior to surgery.              Men may shave face and neck.   Do not bring valuables to the hospital. [place].  Contacts, dentures or bridgework may not be worn into surgery.  Leave suitcase in the car. After surgery it may be brought to your room.     Patients discharged the day of surgery will not be allowed to drive home. IF YOU ARE HAVING SURGERY AND GOING HOME THE SAME DAY, YOU MUST HAVE AN ADULT TO DRIVE YOU HOME AND BE WITH YOU FOR 24 HOURS. YOU MAY GO HOME BY TAXI OR UBER OR ORTHERWISE, BUT AN ADULT MUST ACCOMPANY YOU HOME AND STAY WITH YOU FOR 24 HOURS.  Name and phone number of your driver:  Special Instructions: N/A              Please read over the following fact sheets you were given: _____________________________________________________________________  [facility] - Preparing for Surgery Before surgery, you can play an important role.  Because skin is not sterile, your skin needs to be as free of germs as possible.  You can reduce the number of germs on your skin by washing with CHG (chlorahexidine gluconate) soap before surgery.  CHG is an antiseptic cleaner which kills germs and bonds with the skin to continue killing germs even after washing. Please DO NOT use if you have an allergy to CHG or antibacterial soaps.  If your skin becomes reddened/irritated stop using the CHG and inform your nurse when you arrive at Short Stay. Do not shave (including legs and underarms) for at least 48 hours prior to the first CHG shower.  You may shave your face/neck. Please follow these instructions carefully:  1.  Shower with CHG Soap the night before surgery and the  morning of Surgery.  2.   If you choose to wash your hair, wash your hair first as usual with your  normal  shampoo.  3.  After you shampoo, rinse your hair and body thoroughly to remove the  shampoo.                           4.  Use CHG as you would any other liquid soap.  You can apply chg directly  to the skin and wash                       Gently with a scrungie or clean washcloth.  5.  Apply the CHG Soap to your body ONLY FROM THE NECK DOWN.   Do not use on face/ open                           Wound or open sores. Avoid contact with eyes, ears mouth and genitals (private parts).                       Wash face,  Genitals (private parts) with your normal soap.             6.  Wash thoroughly, paying special attention to the area  where your surgery  will be performed.  7.  Thoroughly rinse your body with warm water from the neck down.  8.  DO NOT shower/wash with your normal soap after using and rinsing off  the CHG Soap.                9.  Pat yourself dry with a clean towel.            10.  Wear clean pajamas.            11.  Place clean sheets on your bed the night of your first shower and do not  sleep with pets. Day of Surgery : Do not apply any lotions/deodorants the morning of surgery.  Please wear clean clothes to the hospital/surgery center.  FAILURE TO FOLLOW THESE INSTRUCTIONS MAY RESULT IN THE CANCELLATION OF YOUR SURGERY PATIENT SIGNATURE_________________________________  NURSE SIGNATURE__________________________________  ________________________________________________________________________

## 2020-10-05 ENCOUNTER — Encounter (HOSPITAL_COMMUNITY): Payer: Self-pay

## 2020-10-05 ENCOUNTER — Other Ambulatory Visit: Payer: Self-pay

## 2020-10-05 ENCOUNTER — Encounter (HOSPITAL_COMMUNITY)
Admission: RE | Admit: 2020-10-05 | Discharge: 2020-10-05 | Disposition: A | Discharge status: Home / Self Care | Payer: No Typology Code available for payment source | Source: Ambulatory Visit | Attending: Surgery | Admitting: Surgery

## 2020-10-05 DIAGNOSIS — Z01818 Encounter for other preprocedural examination: Secondary | ICD-10-CM | POA: Diagnosis present

## 2020-10-05 LAB — CBC WITH DIFFERENTIAL/PLATELET
Abs Immature Granulocytes: 0.05 10*3/uL (ref 0.00–0.07)
Basophils Absolute: 0 10*3/uL (ref 0.0–0.1)
Basophils Relative: 0 %
Eosinophils Absolute: 0 10*3/uL (ref 0.0–0.5)
Eosinophils Relative: 0 %
HCT: 41.8 % (ref 36.0–46.0)
Hemoglobin: 13.7 g/dL (ref 12.0–15.0)
Immature Granulocytes: 1 %
Lymphocytes Relative: 18 %
Lymphs Abs: 1.6 10*3/uL (ref 0.7–4.0)
MCH: 29.3 pg (ref 26.0–34.0)
MCHC: 32.8 g/dL (ref 30.0–36.0)
MCV: 89.3 fL (ref 80.0–100.0)
Monocytes Absolute: 0.6 10*3/uL (ref 0.1–1.0)
Monocytes Relative: 6 %
Neutro Abs: 6.7 10*3/uL (ref 1.7–7.7)
Neutrophils Relative %: 75 %
Platelets: 284 10*3/uL (ref 150–400)
RBC: 4.68 MIL/uL (ref 3.87–5.11)
RDW: 13.1 % (ref 11.5–15.5)
WBC: 8.9 10*3/uL (ref 4.0–10.5)
nRBC: 0 % (ref 0.0–0.2)

## 2020-10-05 LAB — TYPE AND SCREEN
ABO/RH(D): A POS
Antibody Screen: NEGATIVE

## 2020-10-05 LAB — COMPREHENSIVE METABOLIC PANEL
ALT: 19 U/L (ref 0–44)
AST: 20 U/L (ref 15–41)
Albumin: 4.2 g/dL (ref 3.5–5.0)
Alkaline Phosphatase: 55 U/L (ref 38–126)
Anion gap: 11 (ref 5–15)
BUN: 18 mg/dL (ref 6–20)
CO2: 24 mmol/L (ref 22–32)
Calcium: 9.3 mg/dL (ref 8.9–10.3)
Chloride: 100 mmol/L (ref 98–111)
Creatinine, Ser: 0.97 mg/dL (ref 0.44–1.00)
GFR, Estimated: >60 mL/min (ref >60)
Glucose, Bld: 78 mg/dL (ref 70–99)
Potassium: 4.1 mmol/L (ref 3.5–5.1)
Sodium: 135 mmol/L (ref 135–145)
Total Bilirubin: 1.2 mg/dL (ref 0.3–1.2)
Total Protein: 7.6 g/dL (ref 6.5–8.1)

## 2020-10-06 NOTE — Progress Notes (Signed)
Final EKG 10/05/20- epic

## 2020-10-12 ENCOUNTER — Other Ambulatory Visit (HOSPITAL_COMMUNITY)
Admission: RE | Admit: 2020-10-12 | Discharge: 2020-10-12 | Disposition: A | Discharge status: Home / Self Care | Payer: No Typology Code available for payment source | Source: Ambulatory Visit | Attending: Surgery | Admitting: Surgery

## 2020-10-12 DIAGNOSIS — Z20822 Contact with and (suspected) exposure to covid-19: Secondary | ICD-10-CM | POA: Diagnosis not present

## 2020-10-12 DIAGNOSIS — Z01812 Encounter for preprocedural laboratory examination: Secondary | ICD-10-CM | POA: Diagnosis not present

## 2020-10-13 LAB — SARS CORONAVIRUS 2 (TAT 6-24 HRS): SARS Coronavirus 2: NEGATIVE

## 2020-10-15 MED ORDER — BUPIVACAINE LIPOSOME 1.3 % IJ SUSP
20.0000 mL | Freq: Once | INTRAMUSCULAR | Status: DC
  Filled 2020-10-15: qty 20

## 2020-10-16 ENCOUNTER — Other Ambulatory Visit: Payer: Self-pay

## 2020-10-16 ENCOUNTER — Inpatient Hospital Stay (HOSPITAL_COMMUNITY): Payer: No Typology Code available for payment source | Admitting: Certified Registered Nurse Anesthetist

## 2020-10-16 ENCOUNTER — Encounter (HOSPITAL_COMMUNITY)
Admission: RE | Disposition: A | Discharge status: Home / Self Care | Payer: Self-pay | Source: Ambulatory Visit | Attending: Surgery

## 2020-10-16 ENCOUNTER — Inpatient Hospital Stay (HOSPITAL_COMMUNITY)
Admission: RE | Admit: 2020-10-16 | Discharge: 2020-10-17 | DRG: 328 | Disposition: A | Discharge status: Home / Self Care | Payer: No Typology Code available for payment source | Source: Ambulatory Visit | Attending: Surgery | Admitting: Surgery

## 2020-10-16 ENCOUNTER — Encounter (HOSPITAL_COMMUNITY): Payer: Self-pay | Admitting: Surgery

## 2020-10-16 DIAGNOSIS — Z8249 Family history of ischemic heart disease and other diseases of the circulatory system: Secondary | ICD-10-CM

## 2020-10-16 DIAGNOSIS — I10 Essential (primary) hypertension: Secondary | ICD-10-CM | POA: Diagnosis present

## 2020-10-16 DIAGNOSIS — K9509 Other complications of gastric band procedure: Principal | ICD-10-CM | POA: Diagnosis present

## 2020-10-16 DIAGNOSIS — Y738 Miscellaneous gastroenterology and urology devices associated with adverse incidents, not elsewhere classified: Secondary | ICD-10-CM | POA: Diagnosis present

## 2020-10-16 DIAGNOSIS — Z9884 Bariatric surgery status: Secondary | ICD-10-CM

## 2020-10-16 DIAGNOSIS — Z79899 Other long term (current) drug therapy: Secondary | ICD-10-CM

## 2020-10-16 DIAGNOSIS — Z6838 Body mass index (BMI) 38.0-38.9, adult: Secondary | ICD-10-CM

## 2020-10-16 HISTORY — PX: LAPAROSCOPIC REPAIR AND REMOVAL OF GASTRIC BAND: SHX5919

## 2020-10-16 HISTORY — PX: UPPER GI ENDOSCOPY: SHX6162

## 2020-10-16 LAB — CREATININE, SERUM
Creatinine, Ser: 1.04 mg/dL — ABNORMAL HIGH (ref 0.44–1.00)
GFR, Estimated: >60 mL/min (ref >60)

## 2020-10-16 LAB — CBC
HCT: 36.4 % (ref 36.0–46.0)
Hemoglobin: 12 g/dL (ref 12.0–15.0)
MCH: 29.9 pg (ref 26.0–34.0)
MCHC: 33 g/dL (ref 30.0–36.0)
MCV: 90.8 fL (ref 80.0–100.0)
Platelets: 230 10*3/uL (ref 150–400)
RBC: 4.01 MIL/uL (ref 3.87–5.11)
RDW: 13.2 % (ref 11.5–15.5)
WBC: 9.9 10*3/uL (ref 4.0–10.5)
nRBC: 0 % (ref 0.0–0.2)

## 2020-10-16 LAB — ABO/RH: ABO/RH(D): A POS

## 2020-10-16 LAB — PREGNANCY, URINE: Preg Test, Ur: NEGATIVE

## 2020-10-16 SURGERY — ENDOSCOPY, UPPER GI TRACT
Anesthesia: General

## 2020-10-16 MED ORDER — MORPHINE SULFATE (PF) 2 MG/ML IV SOLN
1.0000 mg | INTRAVENOUS | Status: DC | PRN
  Administered 2020-10-16: 1 mg via INTRAVENOUS
  Filled 2020-10-16: qty 1

## 2020-10-16 MED ORDER — MEPERIDINE HCL 50 MG/ML IJ SOLN: 6.2500 mg | INTRAMUSCULAR | Status: DC | PRN

## 2020-10-16 MED ORDER — ROCURONIUM BROMIDE 10 MG/ML (PF) SYRINGE
PREFILLED_SYRINGE | INTRAVENOUS | Status: AC
  Filled 2020-10-16: qty 10

## 2020-10-16 MED ORDER — ACETAMINOPHEN 500 MG PO TABS
1000.0000 mg | ORAL_TABLET | ORAL | Status: AC
  Administered 2020-10-16: 1000 mg via ORAL
  Filled 2020-10-16: qty 2

## 2020-10-16 MED ORDER — MORPHINE SULFATE (PF) 2 MG/ML IV SOLN: 1.0000 mg | INTRAVENOUS | Status: DC | PRN

## 2020-10-16 MED ORDER — SCOPOLAMINE 1 MG/3DAYS TD PT72
1.0000 | MEDICATED_PATCH | TRANSDERMAL | Status: DC
  Administered 2020-10-16: 1.5 mg via TRANSDERMAL
  Filled 2020-10-16: qty 1

## 2020-10-16 MED ORDER — PROPOFOL 10 MG/ML IV BOLUS
INTRAVENOUS | Status: AC
  Filled 2020-10-16: qty 20

## 2020-10-16 MED ORDER — SODIUM CHLORIDE (PF) 0.9 % IJ SOLN
INTRAMUSCULAR | Status: DC | PRN
  Administered 2020-10-16: 10 mL

## 2020-10-16 MED ORDER — ENSURE MAX PROTEIN PO LIQD
2.0000 [oz_av] | ORAL | Status: DC
  Administered 2020-10-17 (×2): 2 [oz_av] via ORAL

## 2020-10-16 MED ORDER — LIDOCAINE 2% (20 MG/ML) 5 ML SYRINGE
INTRAMUSCULAR | Status: DC | PRN
  Administered 2020-10-16: 100 mg via INTRAVENOUS

## 2020-10-16 MED ORDER — MIDAZOLAM HCL 2 MG/2ML IJ SOLN
INTRAMUSCULAR | Status: AC
  Filled 2020-10-16: qty 2

## 2020-10-16 MED ORDER — FENTANYL CITRATE (PF) 100 MCG/2ML IJ SOLN
25.0000 ug | INTRAMUSCULAR | Status: DC | PRN
  Administered 2020-10-16: 50 ug via INTRAVENOUS

## 2020-10-16 MED ORDER — CHLORHEXIDINE GLUCONATE CLOTH 2 % EX PADS: 6.0000 | MEDICATED_PAD | Freq: Once | CUTANEOUS | Status: DC

## 2020-10-16 MED ORDER — PHENYLEPHRINE 40 MCG/ML (10ML) SYRINGE FOR IV PUSH (FOR BLOOD PRESSURE SUPPORT)
PREFILLED_SYRINGE | INTRAVENOUS | Status: AC
  Filled 2020-10-16: qty 10

## 2020-10-16 MED ORDER — ROCURONIUM BROMIDE 10 MG/ML (PF) SYRINGE
PREFILLED_SYRINGE | INTRAVENOUS | Status: DC | PRN
  Administered 2020-10-16: 40 mg via INTRAVENOUS
  Administered 2020-10-16: 60 mg via INTRAVENOUS

## 2020-10-16 MED ORDER — LIDOCAINE 2% (20 MG/ML) 5 ML SYRINGE
INTRAMUSCULAR | Status: AC
  Filled 2020-10-16: qty 5

## 2020-10-16 MED ORDER — LACTATED RINGERS IV SOLN: INTRAVENOUS | Status: DC

## 2020-10-16 MED ORDER — PANTOPRAZOLE SODIUM 40 MG IV SOLR: 40.0000 mg | Freq: Every day | INTRAVENOUS | Status: DC

## 2020-10-16 MED ORDER — HEPARIN SODIUM (PORCINE) 5000 UNIT/ML IJ SOLN
5000.0000 [IU] | Freq: Three times a day (TID) | INTRAMUSCULAR | Status: DC
  Administered 2020-10-16 – 2020-10-17 (×3): 5000 [IU] via SUBCUTANEOUS
  Filled 2020-10-16 (×2): qty 1

## 2020-10-16 MED ORDER — LACTATED RINGERS IR SOLN
Status: DC | PRN
  Administered 2020-10-16: 1000 mL

## 2020-10-16 MED ORDER — ACETAMINOPHEN 500 MG PO TABS
1000.0000 mg | ORAL_TABLET | Freq: Three times a day (TID) | ORAL | Status: DC
  Administered 2020-10-16 – 2020-10-17 (×2): 1000 mg via ORAL
  Filled 2020-10-16 (×2): qty 2

## 2020-10-16 MED ORDER — METOPROLOL TARTRATE 5 MG/5ML IV SOLN
5.0000 mg | Freq: Four times a day (QID) | INTRAVENOUS | Status: DC | PRN
  Administered 2020-10-16: 5 mg via INTRAVENOUS
  Filled 2020-10-16: qty 5

## 2020-10-16 MED ORDER — ENSURE MAX PROTEIN PO LIQD: 2.0000 [oz_av] | ORAL | Status: DC

## 2020-10-16 MED ORDER — LIDOCAINE 2% (20 MG/ML) 5 ML SYRINGE
INTRAMUSCULAR | Status: DC | PRN
  Administered 2020-10-16: 1.5 mg/kg/h via INTRAVENOUS

## 2020-10-16 MED ORDER — APREPITANT 40 MG PO CAPS
40.0000 mg | ORAL_CAPSULE | ORAL | Status: AC
  Administered 2020-10-16: 40 mg via ORAL
  Filled 2020-10-16: qty 1

## 2020-10-16 MED ORDER — FENTANYL CITRATE (PF) 100 MCG/2ML IJ SOLN
INTRAMUSCULAR | Status: AC
  Administered 2020-10-16: 50 ug via INTRAVENOUS
  Filled 2020-10-16: qty 2

## 2020-10-16 MED ORDER — HEPARIN SODIUM (PORCINE) 5000 UNIT/ML IJ SOLN: 5000.0000 [IU] | Freq: Three times a day (TID) | INTRAMUSCULAR | Status: DC

## 2020-10-16 MED ORDER — ACETAMINOPHEN 500 MG PO TABS: 1000.0000 mg | ORAL_TABLET | Freq: Three times a day (TID) | ORAL | Status: DC

## 2020-10-16 MED ORDER — OXYCODONE HCL 5 MG/5ML PO SOLN
5.0000 mg | Freq: Four times a day (QID) | ORAL | Status: DC | PRN
  Administered 2020-10-17: 5 mg via ORAL
  Filled 2020-10-16: qty 5

## 2020-10-16 MED ORDER — ONDANSETRON HCL 4 MG/2ML IJ SOLN: 4.0000 mg | INTRAMUSCULAR | Status: DC | PRN

## 2020-10-16 MED ORDER — DEXAMETHASONE SODIUM PHOSPHATE 10 MG/ML IJ SOLN
INTRAMUSCULAR | Status: AC
  Filled 2020-10-16: qty 1

## 2020-10-16 MED ORDER — HEPARIN SODIUM (PORCINE) 5000 UNIT/ML IJ SOLN
5000.0000 [IU] | INTRAMUSCULAR | Status: AC
  Administered 2020-10-16: 5000 [IU] via SUBCUTANEOUS
  Filled 2020-10-16: qty 1

## 2020-10-16 MED ORDER — AMISULPRIDE (ANTIEMETIC) 5 MG/2ML IV SOLN: 10.0000 mg | Freq: Once | INTRAVENOUS | Status: DC | PRN

## 2020-10-16 MED ORDER — BUPIVACAINE LIPOSOME 1.3 % IJ SUSP
INTRAMUSCULAR | Status: DC | PRN
  Administered 2020-10-16: 20 mL

## 2020-10-16 MED ORDER — PROPOFOL 10 MG/ML IV BOLUS
INTRAVENOUS | Status: DC | PRN
  Administered 2020-10-16: 170 mg via INTRAVENOUS

## 2020-10-16 MED ORDER — VASOPRESSIN 20 UNIT/ML IV SOLN
INTRAVENOUS | Status: AC
  Filled 2020-10-16: qty 1

## 2020-10-16 MED ORDER — PANTOPRAZOLE SODIUM 40 MG IV SOLR
40.0000 mg | Freq: Every day | INTRAVENOUS | Status: DC
  Administered 2020-10-16: 40 mg via INTRAVENOUS
  Filled 2020-10-16: qty 40

## 2020-10-16 MED ORDER — ONDANSETRON HCL 4 MG/2ML IJ SOLN
INTRAMUSCULAR | Status: AC
  Filled 2020-10-16: qty 2

## 2020-10-16 MED ORDER — ACETAMINOPHEN 160 MG/5ML PO SOLN: 1000.0000 mg | Freq: Three times a day (TID) | ORAL | Status: DC

## 2020-10-16 MED ORDER — ACETAMINOPHEN 160 MG/5ML PO SOLN: 325.0000 mg | Freq: Once | ORAL | Status: DC | PRN

## 2020-10-16 MED ORDER — KCL IN DEXTROSE-NACL 20-5-0.45 MEQ/L-%-% IV SOLN
INTRAVENOUS | Status: DC
  Filled 2020-10-16 (×2): qty 1000

## 2020-10-16 MED ORDER — CHLORHEXIDINE GLUCONATE 0.12 % MT SOLN
15.0000 mL | Freq: Once | OROMUCOSAL | Status: AC
  Administered 2020-10-16: 15 mL via OROMUCOSAL

## 2020-10-16 MED ORDER — SODIUM CHLORIDE (PF) 0.9 % IJ SOLN
INTRAMUSCULAR | Status: AC
  Filled 2020-10-16: qty 10

## 2020-10-16 MED ORDER — DEXAMETHASONE SODIUM PHOSPHATE 4 MG/ML IJ SOLN
INTRAMUSCULAR | Status: DC | PRN
  Administered 2020-10-16: 10 mg via INTRAVENOUS

## 2020-10-16 MED ORDER — FENTANYL CITRATE (PF) 100 MCG/2ML IJ SOLN
INTRAMUSCULAR | Status: AC
  Filled 2020-10-16: qty 2

## 2020-10-16 MED ORDER — FENTANYL CITRATE (PF) 250 MCG/5ML IJ SOLN
INTRAMUSCULAR | Status: DC | PRN
  Administered 2020-10-16: 100 ug via INTRAVENOUS

## 2020-10-16 MED ORDER — SODIUM CHLORIDE 0.9 % IV SOLN
2.0000 g | INTRAVENOUS | Status: AC
  Administered 2020-10-16: 2 g via INTRAVENOUS
  Filled 2020-10-16: qty 2

## 2020-10-16 MED ORDER — ACETAMINOPHEN 325 MG PO TABS: 325.0000 mg | ORAL_TABLET | Freq: Once | ORAL | Status: DC | PRN

## 2020-10-16 MED ORDER — 0.9 % SODIUM CHLORIDE (POUR BTL) OPTIME
TOPICAL | Status: DC | PRN
  Administered 2020-10-16: 1000 mL

## 2020-10-16 MED ORDER — SUGAMMADEX SODIUM 200 MG/2ML IV SOLN
INTRAVENOUS | Status: DC | PRN
  Administered 2020-10-16: 200 mg via INTRAVENOUS

## 2020-10-16 MED ORDER — ORAL CARE MOUTH RINSE: 15.0000 mL | Freq: Once | OROMUCOSAL | Status: AC

## 2020-10-16 MED ORDER — OXYCODONE HCL 5 MG/5ML PO SOLN: 5.0000 mg | Freq: Four times a day (QID) | ORAL | Status: DC | PRN

## 2020-10-16 MED ORDER — MIDAZOLAM HCL 5 MG/5ML IJ SOLN
INTRAMUSCULAR | Status: DC | PRN
  Administered 2020-10-16: 2 mg via INTRAVENOUS

## 2020-10-16 MED ORDER — ACETAMINOPHEN 10 MG/ML IV SOLN: 1000.0000 mg | Freq: Once | INTRAVENOUS | Status: DC | PRN

## 2020-10-16 SURGICAL SUPPLY — 76 items
ADH SKN CLS APL DERMABOND .7 (GAUZE/BANDAGES/DRESSINGS) ×1
APL SWBSTK 6 STRL LF DISP (MISCELLANEOUS) ×1
APPLICATOR COTTON TIP 6 STRL (MISCELLANEOUS) IMPLANT
APPLICATOR COTTON TIP 6IN STRL (MISCELLANEOUS) ×2
APPLIER CLIP 5 13 M/L LIGAMAX5 (MISCELLANEOUS)
APPLIER CLIP ROT 10 11.4 M/L (STAPLE)
APPLIER CLIP ROT 13.4 12 LRG (CLIP)
APR CLP LRG 13.4X12 ROT 20 MLT (CLIP)
APR CLP MED LRG 11.4X10 (STAPLE)
APR CLP MED LRG 5 ANG JAW (MISCELLANEOUS)
BLADE HEX COATED 2.75 (ELECTRODE) ×2 IMPLANT
BLADE SURG 15 STRL LF DISP TIS (BLADE) ×1 IMPLANT
BLADE SURG 15 STRL SS (BLADE) ×2
CABLE HIGH FREQUENCY MONO STRZ (ELECTRODE) ×1 IMPLANT
CLIP APPLIE 5 13 M/L LIGAMAX5 (MISCELLANEOUS) IMPLANT
CLIP APPLIE ROT 10 11.4 M/L (STAPLE) IMPLANT
CLIP APPLIE ROT 13.4 12 LRG (CLIP) IMPLANT
COVER WAND RF STERILE (DRAPES) IMPLANT
DECANTER SPIKE VIAL GLASS SM (MISCELLANEOUS) ×3 IMPLANT
DERMABOND ADVANCED (GAUZE/BANDAGES/DRESSINGS) ×1
DERMABOND ADVANCED .7 DNX12 (GAUZE/BANDAGES/DRESSINGS) IMPLANT
DEVICE SUT QUICK LOAD TK 5 (STAPLE) IMPLANT
DEVICE SUT TI-KNOT TK 5X26 (MISCELLANEOUS) IMPLANT
DEVICE SUTURE ENDOST 10MM (ENDOMECHANICALS) IMPLANT
DISSECTOR BLUNT TIP ENDO 5MM (MISCELLANEOUS) IMPLANT
ELECT L-HOOK LAP 45CM DISP (ELECTROSURGICAL) ×2
ELECT PENCIL ROCKER SW 15FT (MISCELLANEOUS) ×1 IMPLANT
ELECT REM PT RETURN 15FT ADLT (MISCELLANEOUS) ×2 IMPLANT
ELECTRODE L-HOOK LAP 45CM DISP (ELECTROSURGICAL) IMPLANT
GLOVE BIOGEL M 8.0 STRL (GLOVE) ×2 IMPLANT
GLOVE BIOGEL PI IND STRL 7.0 (GLOVE) ×1 IMPLANT
GLOVE BIOGEL PI INDICATOR 7.0 (GLOVE) ×1
GOWN STRL REUS W/TWL XL LVL3 (GOWN DISPOSABLE) ×8 IMPLANT
GRASPER SUT TROCAR 14GX15 (MISCELLANEOUS) ×2 IMPLANT
HANDLE STAPLE EGIA 4 XL (STAPLE) ×2 IMPLANT
KIT BASIN OR (CUSTOM PROCEDURE TRAY) ×2 IMPLANT
KIT TURNOVER KIT A (KITS) ×1 IMPLANT
MARKER SKIN DUAL TIP RULER LAB (MISCELLANEOUS) ×2 IMPLANT
MAT PREVALON FULL STRYKER (MISCELLANEOUS) ×2 IMPLANT
NDL SPNL 22GX3.5 QUINCKE BK (NEEDLE) ×1 IMPLANT
NEEDLE SPNL 22GX3.5 QUINCKE BK (NEEDLE) ×2 IMPLANT
PACK CARDIOVASCULAR III (CUSTOM PROCEDURE TRAY) ×2 IMPLANT
PACK UNIVERSAL I (CUSTOM PROCEDURE TRAY) ×2 IMPLANT
PENCIL SMOKE EVACUATOR (MISCELLANEOUS) IMPLANT
RELOAD STAPLE 45 PURP MED/THCK (STAPLE) IMPLANT
RELOAD TRI 45 ART MED THCK BLK (STAPLE) ×2 IMPLANT
RELOAD TRI 45 ART MED THCK PUR (STAPLE) IMPLANT
RELOAD TRI 60 ART MED THCK BLK (STAPLE) ×4 IMPLANT
RELOAD TRI 60 ART MED THCK PUR (STAPLE) ×1 IMPLANT
SCISSORS LAP 5X45 EPIX DISP (ENDOMECHANICALS) ×2 IMPLANT
SET IRRIG TUBING LAPAROSCOPIC (IRRIGATION / IRRIGATOR) ×2 IMPLANT
SET TUBE SMOKE EVAC HIGH FLOW (TUBING) ×2 IMPLANT
SHEARS HARMONIC ACE PLUS 45CM (MISCELLANEOUS) ×2 IMPLANT
SLEEVE ADV FIXATION 5X100MM (TROCAR) ×4 IMPLANT
SLEEVE GASTRECTOMY 36FR VISIGI (MISCELLANEOUS) ×2 IMPLANT
SOL ANTI FOG 6CC (MISCELLANEOUS) ×1 IMPLANT
SOLUTION ANTI FOG 6CC (MISCELLANEOUS) ×1
SPONGE LAP 18X18 RF (DISPOSABLE) ×2 IMPLANT
STAPLER VISISTAT 35W (STAPLE) ×2 IMPLANT
SUT MNCRL AB 4-0 PS2 18 (SUTURE) ×4 IMPLANT
SUT SURGIDAC NAB ES-9 0 48 120 (SUTURE) IMPLANT
SUT VIC AB 2-0 SH 27 (SUTURE) ×2
SUT VIC AB 2-0 SH 27X BRD (SUTURE) IMPLANT
SUT VIC AB 4-0 SH 18 (SUTURE) ×2 IMPLANT
SUT VICRYL 0 TIES 12 18 (SUTURE) ×2 IMPLANT
SYR 10ML ECCENTRIC (SYRINGE) ×2 IMPLANT
SYR 20ML LL LF (SYRINGE) ×2 IMPLANT
TOWEL OR 17X26 10 PK STRL BLUE (TOWEL DISPOSABLE) ×4 IMPLANT
TOWEL OR NON WOVEN STRL DISP B (DISPOSABLE) ×2 IMPLANT
TRAY FOLEY MTR SLVR 16FR STAT (SET/KITS/TRAYS/PACK) IMPLANT
TROCAR ADV FIXATION 5X100MM (TROCAR) ×2 IMPLANT
TROCAR BLADELESS 15MM (ENDOMECHANICALS) ×2 IMPLANT
TROCAR BLADELESS OPT 5 100 (ENDOMECHANICALS) ×2 IMPLANT
TUBE CALIBRATION LAPBAND (TUBING) IMPLANT
TUBING CONNECTING 10 (TUBING) ×3 IMPLANT
TUBING ENDO SMARTCAP (MISCELLANEOUS) ×2 IMPLANT

## 2020-10-16 NOTE — Anesthesia Postprocedure Evaluation (Signed)
Anesthesia Post Note  Patient: Samantha [name]  Procedure(s) Performed: UPPER GI ENDOSCOPY (N/A ) CONVERSION FROM LAP BAND TO LAPAROSCOPIC GASTRIC SLEEVE - REMOVAL LAP BAND AND COMPONENTS (N/A )     Patient location during evaluation: PACU Anesthesia Type: General Level of consciousness: awake and alert Pain management: pain level controlled Vital Signs Assessment: post-procedure vital signs reviewed and stable Respiratory status: spontaneous breathing, nonlabored ventilation, respiratory function stable and patient connected to nasal cannula oxygen Cardiovascular status: blood pressure returned to baseline and stable Postop Assessment: no apparent nausea or vomiting Anesthetic complications: no   No complications documented.  Last Vitals:  Vitals:   10/16/20 1115 10/16/20 1130  BP: (!) 153/102 (!) 155/108  Pulse: 75 80  Resp: 12 15  Temp:  (!) 36.3 C  SpO2: 95% 100%                  Shelton Silvas

## 2020-10-16 NOTE — Progress Notes (Signed)
Unable to start water as ordered, patient too sleepy

## 2020-10-16 NOTE — Anesthesia Preprocedure Evaluation (Addendum)
Anesthesia Evaluation  Patient identified by MRN, date of birth, ID band Patient awake    Reviewed: Allergy & Precautions, NPO status , Patient's Chart, lab work & pertinent test results  Airway Mallampati: III  TM Distance: >3 FB Neck ROM: Full    Dental  (+) Teeth Intact, Dental Advisory Given   Pulmonary neg pulmonary ROS,    breath sounds clear to auscultation       Cardiovascular hypertension, Pt. on medications  Rhythm:Regular Rate:Normal     Neuro/Psych negative neurological ROS  negative psych ROS   GI/Hepatic negative GI ROS, Neg liver ROS,   Endo/Other  negative endocrine ROS  Renal/GU negative Renal ROS     Musculoskeletal  (+) Arthritis ,   Abdominal (+) + obese,   Peds  Hematology negative hematology ROS (+)   Anesthesia Other Findings   Reproductive/Obstetrics                            Anesthesia Physical Anesthesia Plan  ASA: II  Anesthesia Plan: General   Post-op Pain Management:    Induction: Intravenous  PONV Risk Score and Plan: 4 or greater and Dexamethasone, Midazolam, Scopolamine patch - Pre-op and Ondansetron  Airway Management Planned: Oral ETT  Additional Equipment: None  Intra-op Plan:   Post-operative Plan: Extubation in OR  Informed Consent: I have reviewed the patients History and Physical, chart, labs and discussed the procedure including the risks, benefits and alternatives for the proposed anesthesia with the patient or authorized representative who has indicated his/her understanding and acceptance.     Dental advisory given  Plan Discussed with: CRNA  Anesthesia Plan Comments:        Anesthesia Quick Evaluation

## 2020-10-16 NOTE — Discharge Instructions (Signed)
   GASTRIC BYPASS/SLEEVE  Home Care Instructions   These instructions are to help you care for yourself when you go home.  Call: If you have any problems. . Call [phone] and ask for the surgeon on call . If you need immediate help, come to the ER at [facility].  . Tell the ER staff that you are a new post-op gastric bypass or gastric sleeve patient   Signs and symptoms to report: . Severe vomiting or nausea o If you cannot keep down clear liquids for longer than 1 day, call your surgeon  . Abdominal pain that does not get better after taking your pain medication . Fever over 100.4 F with chills . Heart beating over 100 beats a minute . Shortness of breath at rest . Chest pain .  Redness, swelling, drainage, or foul odor at incision (surgical) sites .  If your incisions open or pull apart . Swelling or pain in calf (lower leg) . Diarrhea (Loose bowel movements that happen often), frequent watery, uncontrolled bowel movements . Constipation, (no bowel movements for 3 days) if this happens: Pick one o Milk of Magnesia, 2 tablespoons by mouth, 3 times a day for 2 days if needed o Stop taking Milk of Magnesia once you have a bowel movement o Call your doctor if constipation continues Or o Miralax  (instead of Milk of Magnesia) following the label instructions o Stop taking Miralax once you have a bowel movement o Call your doctor if constipation continues . Anything you think is not normal   Normal side effects after surgery: . Unable to sleep at night or unable to focus . Irritability or moody . Being tearful (crying) or depressed These are common complaints, possibly related to your anesthesia medications that put you to sleep, stress of surgery, and change in lifestyle.  This usually goes away a few weeks after surgery.  If these feelings continue, call your primary care doctor.   Wound Care: You may have surgical glue, steri-strips, or staples over your incisions after  surgery . Surgical glue:  Looks like a clear film over your incisions and will wear off a little at a time . Steri-strips: Strips of tape over your incisions. You may notice a yellowish color on the skin under the steri-strips. This is used to make the   steri-strips stick better. Do not pull the steri-strips off - let them fall off . Staples: Staples may be removed before you leave the hospital o If you go home with staples, call [facility], [phone] at for an appointment with your surgeon's nurse to have staples removed 10 days after surgery. . Showering: You may shower two (2) days after your surgery unless your surgeon tells you differently o Wash gently around incisions with warm soapy water, rinse well, and gently pat dry  o No tub baths until staples are removed, steri-strips fall off or glue is gone.    Medications: . Medications should be liquid or crushed if larger than the size of a dime . Extended release pills (medication that release a little bit at a time through the day) should NOT be crushed or cut. (examples include XL, ER, DR, SR) . Depending on the size and number of medications you take, you may need to space (take a few throughout the day)/change the time you take your medications so that you do not over-fill your pouch (smaller stomach) . Make sure you follow-up with your primary care doctor to   make medication changes needed during rapid weight loss and life-style changes . If you have diabetes, follow up with the doctor that orders your diabetes medication(s) within one week after surgery and check your blood sugar regularly. . Do not drive while taking prescription pain medication  . It is ok to take Tylenol by the bottle instructions with your pain medicine or instead of your pain medicine as needed.  DO NOT TAKE NSAIDS (EXAMPLES OF NSAIDS:  IBUPROFREN/ NAPROXEN)  Diet:                    First 2 Weeks  You will see the dietician t about two (2) weeks  after your surgery. The dietician will increase the types of foods you can eat if you are handling liquids well: . If you have severe vomiting or nausea and cannot keep down clear liquids lasting longer than 1 day, call your surgeon @ ([phone]) Protein Shake . Drink at least 2 ounces of shake 5-6 times per day . Each serving of protein shakes (usually 8 - 12 ounces) should have: o 15 grams of protein  o And no more than 5 grams of carbohydrate  . Goal for protein each day: o Men = 80 grams per day o Women = 60 grams per day . Protein powder may be added to fluids such as non-fat milk or Lactaid milk or unsweetened Soy/Almond milk (limit to 35 grams added protein powder per serving)  Hydration . Slowly increase the amount of water and other clear liquids as tolerated (See Acceptable Fluids) . Slowly increase the amount of protein shake as tolerated  .  Sip fluids slowly and throughout the day.  Do not use straws. . May use sugar substitutes in small amounts (no more than 6 - 8 packets per day; i.e. Splenda)  Fluid Goal . The first goal is to drink at least 8 ounces of protein shake/drink per day (or as directed by the nutritionist); some examples of protein shakes are Syntrax Nectar, Adkins Advantage, EAS Edge HP, and Unjury. See handout from pre-op Bariatric Education Class: o Slowly increase the amount of protein shake you drink as tolerated o You may find it easier to slowly sip shakes throughout the day o It is important to get your proteins in first . Your fluid goal is to drink 64 - 100 ounces of fluid daily o It may take a few weeks to build up to this . 32 oz (or more) should be clear liquids  And  . 32 oz (or more) should be full liquids (see below for examples) . Liquids should not contain sugar, caffeine, or carbonation  Clear Liquids: . Water or Sugar-free flavored water (i.e. Fruit H2O, Propel) . Decaffeinated coffee or tea (sugar-free) . Crystal Lite, Wyler's Lite,  Minute Maid Lite . Sugar-free Jell-O . Bouillon or broth . Sugar-free Popsicle:   *Less than 20 calories each; Limit 1 per day  Full Liquids: Protein Shakes/Drinks + 2 choices per day of other full liquids . Full liquids must be: o No More Than 15 grams of Carbs per serving  o No More Than 3 grams of Fat per serving . Strained low-fat cream soup (except Cream of Potato or Tomato) . Non-Fat milk . Fat-free Lactaid Milk . Unsweetened Soy Or Unsweetened Almond Milk . Low Sugar yogurt (Dannon Lite & Fit, Greek yogurt; Oikos Triple Zero; Chobani Simply 100; Yoplait 100 calorie Greek - No Fruit on the Bottom)    Vitamins   and Minerals . Start 1 day after surgery unless otherwise directed by your surgeon . Chewable Bariatric Specific Multivitamin / Multimineral Supplement with iron (Example: Bariatric Advantage Multi EA) . Chewable Calcium with Vitamin D-3 (Example: 3 Chewable Calcium Plus 600 with Vitamin D-3) o Take 500 mg three (3) times a day for a total of 1500 mg each day o Do not take all 3 doses of calcium at one time as it may cause constipation, and you can only absorb 500 mg  at a time  o Do not mix multivitamins containing iron with calcium supplements; take 2 hours apart . Menstruating women and those with a history of anemia (a blood disease that causes weakness) may need extra iron o Talk with your doctor to see if you need more iron . Do not stop taking or change any vitamins or minerals until you talk to your dietitian or surgeon . Your Dietitian and/or surgeon must approve all vitamin and mineral supplements   Activity and Exercise: Limit your physical activity as instructed by your doctor.  It is important to continue walking at home.  During this time, use these guidelines: . Do not lift anything greater than ten (10) pounds for at least two (2) weeks . Do not go back to work or drive until Designer, industrial/product says you can . You may have sex when you feel comfortable  o It is  VERY important for female patients to use a reliable birth control method; fertility often increases after surgery  o All hormonal birth control will be ineffective for 30 days after surgery due to medications given during surgery a barrier method must be used. o Do not get pregnant for at least 18 months . Start exercising as soon as your doctor tells you that you can o Make sure your doctor approves any physical activity . Start with a simple walking program . Walk 5-15 minutes each day, 7 days per week.  . Slowly increase until you are walking 30-45 minutes per day Consider joining our BELT program. [phone] or email [email or web address] [email or web address]   Special Instructions Things to remember: . Use your CPAP when sleeping if this applies to you  . [facility] has a free Bariatric Surgery Support Groups that meets monthly o The 3rd Thursday of each month, 6 pm, [employer] [address]  . It is very important to keep all follow up appointments with your surgeon, dietitian, primary care physician, and behavioral health practitioner . Routine follow up schedule with your surgeon include appointments at 2-3 weeks, 6-8 weeks, 6 months, and 1 year at a minimum.  Your surgeon may request to see you more often.   . After the first year, please follow up with your bariatric surgeon and dietitian at least once a year in order to maintain best weight loss results   [facility]: [phone] [facility]: [phone] Bariatric Nurse Coordinator: [phone]      Reviewed and Endorsed  by [facility] Patient Education Committee, June, 2016 Edits Approved: Aug, 2018

## 2020-10-16 NOTE — Anesthesia Procedure Notes (Signed)
Procedure Name: Intubation Date/Time: 10/16/2020 7:34 AM Performed by: [place] Alisandra Son C, CRNA Pre-anesthesia Checklist: Patient identified, Emergency Drugs available, Suction available and Patient being monitored Patient Re-evaluated:Patient Re-evaluated prior to induction Oxygen Delivery Method: Circle system utilized Preoxygenation: Pre-oxygenation with 100% oxygen Induction Type: IV induction Ventilation: Mask ventilation without difficulty Laryngoscope Size: Mac and 3 Grade View: Grade I Tube type: Oral Tube size: 7.0 mm Number of attempts: 1 Airway Equipment and Method: Stylet and Oral airway Placement Confirmation: ETT inserted through vocal cords under direct vision,  positive ETCO2 and breath sounds checked- equal and bilateral Secured at: 20 cm Tube secured with: Tape Dental Injury: Teeth and Oropharynx as per pre-operative assessment

## 2020-10-16 NOTE — Progress Notes (Signed)
Attempted to start waters, patient request this nurse come back in a little bit, she was too sleepy

## 2020-10-16 NOTE — Op Note (Signed)
Preoperative diagnosis: laparoscopic sleeve gastrectomy  Postoperative diagnosis: Same   Procedure: Upper endoscopy   Surgeon: Twanna Resh, M.D.  Anesthesia: Gen.   Indications for procedure: This patient was undergoing a laparoscopic sleeve gastrectomy.   Description of procedure: The endoscopy was placed in the mouth and into the oropharynx and under endoscopic vision it was advanced to the esophagogastric junction. The pouch was insufflated and no bleeding or bubbles were seen. The GEJ was identified at 36cm from the teeth. No bleeding or leaks were detected. The scope was withdrawn without difficulty.   Serenity Fortner, M.D. General, Bariatric, & Minimally Invasive Surgery [facility]    

## 2020-10-16 NOTE — Op Note (Signed)
16 Oct 2020  Surgeon: Wenda Low, MD, FACS  Asst:  Feliciana Rossetti, MD, FACS  Anes:  General endotracheal  Procedure: Laparoscopic removal of APS Lapband (2009) sleeve gastrectomy and upper endoscopy  Diagnosis: Morbid obesity  Complications: None noted  EBL:   10 cc  Description of Procedure:  The patient was take to OR 1 and given general anesthesia.  The abdomen was prepped with Technicare and draped sterilely.  A timeout was performed.  Access to the abdomen was achieved with a 5 mm trocar in the left upper quadrant.  Following insufflation, the state of the abdomen was found to be with some adhesions related to the band.   The lapband was takend down and removed with its plication.  It was removed throught the 15 mm trocar in right midline.  There was no evidence of erosion.   The cicatrix was divided sharply.   The ViSiGi 36Fr tube was inserted to deflate the stomach and was pulled back into the esophagus.    The pylorus was identified and we measured 5 cm back and marked the antrum.  At that point we began dissection to take down the greater curvature of the stomach using the Harmonic scalpel.  This dissection was taken all the way up to the left crus.  Posterior attachments of the stomach were also taken down.    The ViSiGi tube was then passed into the antrum and suction applied so that it was snug along the lessor curvature.  The "crow's foot" or incisura was identified.  The sleeve gastrectomy was begun using the [employer] stapler beginning with a 4.5 black load with TRS followed by a 6 cm black with TRS.  Then one purple and the rest were black loads with TRS.  Marland Kitchen  When the sleeve was complete the tube was taken off suction and insufflated briefly.  The tube was withdrawn.   The lapband port was removed and the stomach was brought through this incision.   This site was closed with two 0 vicryl sutures with PMI.   Upper endoscopy was then performed by Dr. Sheliah Hatch which  showed no bleeding or bubbles.      Local was provided by infiltrating with Exparel as a TAP block and closed 4-0 Monocryl and Dermabond.    Matt B. Daphine Deutscher, MD, [facility], [place] [phone]

## 2020-10-16 NOTE — Progress Notes (Signed)
PHARMACY CONSULT FOR:  Risk Assessment for Post-Discharge VTE Following Bariatric Surgery  Post-Discharge VTE Risk Assessment: This patient's probability of 30-day post-discharge VTE is increased due to the factors marked:   Female    Age >/=60 years    BMI >/=50 kg/m2    CHF    Dyspnea at Rest    Paraplegia  X  Non-gastric-band surgery    Operation Time >/=3 hr    Return to OR     Length of Stay >/= 3 d   Hx of VTE   Hypercoagulable condition   Significant venous stasis       Predicted probability of 30-day post-discharge VTE: 0.16%  Other patient-specific factors to consider: N/A   Recommendation for Discharge: No pharmacologic prophylaxis post-discharge      Samantha [name] is a 49 y.o. female who underwent laparoscopic removal of APS Lapband, sleeve gastrectomy, and upper endoscopy on 10/16/2020.    Case start: 0802 Case end: 1012   No Known Allergies  Patient Measurements: Height: 5' 9.5" (176.5 cm) Weight: 119.9 kg (264 lb 6.4 oz) IBW/kg (Calculated) : 67.35 Body mass index is 38.49 kg/m.  Recent Labs    10/16/20 1221  CREATININE 1.04*   Estimated Creatinine Clearance: 93.3 mL/min (A) (by C-G formula based on SCr of 1.04 mg/dL (H)).    Past Medical History:  Diagnosis Date  . Arthritis   . Contact lens/glasses fitting   . History of swelling of feet   . Hypertension      Medications Prior to Admission  Medication Sig Dispense Refill Last Dose  . DEPO-SUBQ PROVERA 104 104 MG/0.65ML injection Inject 104 mg into the skin every 3 (three) months.     Marland Kitchen lisinopril-hydrochlorothiazide (ZESTORETIC) 20-12.5 MG tablet Take 1 tablet by mouth daily.   10/15/2020 at Unknown time  . naproxen (NAPROSYN) 500 MG tablet Take 500 mg by mouth 2 (two) times daily as needed (pain.).    Past Week at Unknown time       [name] 10/16/2020,2:20 PM

## 2020-10-16 NOTE — Interval H&P Note (Signed)
History and Physical Interval Note:  10/16/2020 7:56 AM  Samantha [name]  has presented today for surgery, with the diagnosis of morbid obesity.  The various methods of treatment have been discussed with the patient and family. After consideration of risks, benefits and other options for treatment, the patient has consented to  Procedure(s): LAPAROSCOPIC GASTRIC SLEEVE RESECTION (N/A) UPPER GI ENDOSCOPY (N/A) CONVERSION FROM LAP BAND TO LAPAROSCOPIC GASTRIC SLEEVE - REMOVAL LAP BAND AND COMPONENTS (N/A) as a surgical intervention.  The patient's history has been reviewed, patient examined, no change in status, stable for surgery.  I have reviewed the patient's chart and labs.  Questions were answered to the patient's satisfaction.     [name]

## 2020-10-16 NOTE — Transfer of Care (Signed)
Immediate Anesthesia Transfer of Care Note  Patient: Samantha [name]  Procedure(s) Performed: UPPER GI ENDOSCOPY (N/A ) CONVERSION FROM LAP BAND TO LAPAROSCOPIC GASTRIC SLEEVE - REMOVAL LAP BAND AND COMPONENTS (N/A )  Patient Location: PACU  Anesthesia Type:General  Level of Consciousness: awake, alert  and oriented  Airway & Oxygen Therapy: Patient Spontanous Breathing and Patient connected to face mask oxygen  Post-op Assessment: Report given to RN and Post -op Vital signs reviewed and stable  Post vital signs: Reviewed and stable  Last Vitals:  Vitals Value Taken Time  BP 152/88 10/16/20 1020  Temp    Pulse 85 10/16/20 1027  Resp 16 10/16/20 1029  SpO2 100 % 10/16/20 1027  Vitals shown include unvalidated device data.  Last Pain:  Vitals:   10/16/20 0544  TempSrc: Oral         Complications: No complications documented.

## 2020-10-16 NOTE — Progress Notes (Signed)
Discussed post op day goals with patient including ambulation, IS, diet progression, pain, and nausea control.  BSTOP education provided including BSTOP information guide, "Guide for Pain Management after your Bariatric Procedure".  Questions answered. 

## 2020-10-17 ENCOUNTER — Encounter (HOSPITAL_COMMUNITY): Payer: Self-pay | Admitting: Surgery

## 2020-10-17 ENCOUNTER — Other Ambulatory Visit (HOSPITAL_COMMUNITY): Payer: Self-pay | Admitting: Surgery

## 2020-10-17 LAB — CBC WITH DIFFERENTIAL/PLATELET
Abs Immature Granulocytes: 0.05 10*3/uL (ref 0.00–0.07)
Basophils Absolute: 0 10*3/uL (ref 0.0–0.1)
Basophils Relative: 0 %
Eosinophils Absolute: 0 10*3/uL (ref 0.0–0.5)
Eosinophils Relative: 0 %
HCT: 38 % (ref 36.0–46.0)
Hemoglobin: 12.8 g/dL (ref 12.0–15.0)
Immature Granulocytes: 0 %
Lymphocytes Relative: 6 %
Lymphs Abs: 0.8 10*3/uL (ref 0.7–4.0)
MCH: 29.6 pg (ref 26.0–34.0)
MCHC: 33.7 g/dL (ref 30.0–36.0)
MCV: 88 fL (ref 80.0–100.0)
Monocytes Absolute: 0.7 10*3/uL (ref 0.1–1.0)
Monocytes Relative: 6 %
Neutro Abs: 10.4 10*3/uL — ABNORMAL HIGH (ref 1.7–7.7)
Neutrophils Relative %: 88 %
Platelets: 264 10*3/uL (ref 150–400)
RBC: 4.32 MIL/uL (ref 3.87–5.11)
RDW: 12.9 % (ref 11.5–15.5)
WBC: 11.9 10*3/uL — ABNORMAL HIGH (ref 4.0–10.5)
nRBC: 0 % (ref 0.0–0.2)

## 2020-10-17 LAB — SURGICAL PATHOLOGY

## 2020-10-17 MED ORDER — OXYCODONE HCL 5 MG PO TABS: 5.0000 mg | ORAL_TABLET | Freq: Four times a day (QID) | ORAL | 0 refills | Status: DC | PRN

## 2020-10-17 MED ORDER — PANTOPRAZOLE SODIUM 40 MG PO TBEC: 40.0000 mg | DELAYED_RELEASE_TABLET | Freq: Every day | ORAL | 0 refills | Status: DC

## 2020-10-17 MED ORDER — ONDANSETRON 4 MG PO TBDP: 4.0000 mg | ORAL_TABLET | Freq: Four times a day (QID) | ORAL | 0 refills | Status: DC | PRN

## 2020-10-17 MED FILL — oxyCODONE HCL 5 MG TABS: 5 | 3 days supply | Qty: 10 | Fill #0

## 2020-10-17 MED FILL — PANTOPRAZOLE SOD DR 40 MG T: 40 | 90 days supply | Qty: 90 | Fill #0

## 2020-10-17 MED FILL — ONDANSETRON ODT 4 MG TABLET: 4 | 5 days supply | Qty: 20 | Fill #0

## 2020-10-17 NOTE — Progress Notes (Signed)
Patient alert and oriented, pain is controlled. Patient is tolerating fluids, advanced to protein shake today, patient is tolerating well.  Reviewed Gastric sleeve discharge instructions with patient and patient is able to articulate understanding.  Provided information on BELT program, Support Group and [facility] outpatient pharmacy. All questions answered, will continue to monitor.  

## 2020-10-17 NOTE — Discharge Summary (Signed)
Physician Discharge Summary  Patient ID: Samantha [name] MRN: [id number] DOB/AGE: 1971-02-19 49 y.o.  PCP: Maud Deed, PA  Admit date: 10/16/2020 Discharge date: 10/17/2020  Admission Diagnoses:  lapband dysfunction  Discharge Diagnoses:  same  Active Problems:   S/P laparoscopic sleeve gastrectomy   Surgery:  Removal of lapband and conversion to sleeve gastrectomy  Discharged Condition: improved  Hospital Course:   Had surgery on Monday and begun on clears.  Diet advanced and she was ready for discharge on Tuesday.    Consults: none  Significant Diagnostic Studies: none    Discharge Exam: Blood pressure (!) 145/89, pulse 77, temperature 98.6 F (37 C), temperature source Oral, resp. rate 17, height 5' 9.5" (1.765 m), weight 119.9 kg, SpO2 99 %. Incisions ok  Disposition: Discharge disposition: 01-Home or Self Care       Discharge Instructions    Ambulate hourly while awake   Complete by: As directed    Call MD for:  difficulty breathing, headache or visual disturbances   Complete by: As directed    Call MD for:  persistant dizziness or light-headedness   Complete by: As directed    Call MD for:  persistant nausea and vomiting   Complete by: As directed    Call MD for:  redness, tenderness, or signs of infection (pain, swelling, redness, odor or green/yellow discharge around incision site)   Complete by: As directed    Call MD for:  severe uncontrolled pain   Complete by: As directed    Call MD for:  temperature >101 F   Complete by: As directed    Diet bariatric full liquid   Complete by: As directed    Incentive spirometry   Complete by: As directed    Perform hourly while awake     Allergies as of 10/17/2020   No Known Allergies     Medication List    STOP taking these medications   naproxen 500 MG tablet Commonly known as: NAPROSYN     TAKE these medications   Depo-SubQ Provera 104 104 MG/0.65ML injection Generic drug:  medroxyPROGESTERone Inject 104 mg into the skin every 3 (three) months.   lisinopril-hydrochlorothiazide 20-12.5 MG tablet Commonly known as: ZESTORETIC Take 1 tablet by mouth daily. Notes to patient: Monitor Blood Pressure Daily and keep a log for primary care physician.  Monitor for symptoms of dehydration.  You may need to make changes to your medications with rapid weight loss.     ondansetron 4 MG disintegrating tablet Commonly known as: ZOFRAN-ODT Take 1 tablet (4 mg total) by mouth every 6 (six) hours as needed for nausea or vomiting.   oxyCODONE 5 MG immediate release tablet Commonly known as: Oxy IR/ROXICODONE Take 1 tablet (5 mg total) by mouth every 6 (six) hours as needed for severe pain.   pantoprazole 40 MG tablet Commonly known as: PROTONIX Take 1 tablet (40 mg total) by mouth daily.       Follow-up Information    Luretha Murphy, MD. Go on 11/16/2020.   Specialty: General Surgery Why: at 245 pm.  Please arrive 15 minutes prior to appointment time Contact information: [address] [place] [address] [phone]        Hedda Slade, PA-C. Go on 12/08/2020.   Specialty: General Surgery Why: at 830.  Please arrive 15 minutes prior to appointment.  Thank you Contact information: [address] [place] [address] [phone]  Signed: [name] 10/17/2020, 9:00 AM

## 2020-10-17 NOTE — Progress Notes (Signed)
Discharge instructions given to pt and all questions were answered.  

## 2020-10-17 NOTE — Progress Notes (Signed)
Patient alert and oriented, Post op day 1.  Provided support and encouragement.  Encouraged pulmonary toilet, ambulation and small sips of liquids.  Completed 12 ounces of bari clear fluid and 6 ounces of protein.  All questions answered.  Will continue to monitor. 

## 2020-10-17 NOTE — Progress Notes (Signed)
Nutrition Note  RD consulted for diet education for patient s/p bariatric surgery. Bariatric nurse coordinator providing education.  If nutrition issues arise, please consult RD.   Junie Engram, MS, RD, LDN Inpatient Clinical Dietitian Contact information available via Am[employer]  

## 2020-10-23 ENCOUNTER — Telehealth (HOSPITAL_COMMUNITY): Payer: Self-pay

## 2020-10-23 NOTE — Telephone Encounter (Signed)
Patient called to discuss post bariatric surgery follow up questions.  See below: ° ° °1.  Tell me about your pain and pain management? ° °2.  Let's talk about fluid intake.  How much total fluid are you taking in? ° °3.  How much protein have you taken in the last 2 days? ° °4.  Have you had nausea?  Tell me about when have experienced nausea and what you did to help? ° °5.  Has the frequency or color changed with your urine? ° °6.  Tell me what your incisions look like? ° °7.  Have you been passing gas? BM? ° °8.  If a problem or question were to arise who would you call?  Do you know contact numbers for [facility], [facility], and [facility]? ° °9.  How has the walking going? ° °10.  How are your vitamins and calcium going?  How are you taking them? °

## 2020-10-31 ENCOUNTER — Other Ambulatory Visit: Payer: Self-pay

## 2020-10-31 ENCOUNTER — Encounter: Payer: No Typology Code available for payment source | Attending: Surgery | Admitting: Skilled Nursing Facility1

## 2020-10-31 DIAGNOSIS — E669 Obesity, unspecified: Secondary | ICD-10-CM | POA: Insufficient documentation

## 2020-11-01 NOTE — Progress Notes (Signed)
2 Week Post-Operative Nutrition Class   Patient was seen on 01/19/19 for Post-Operative Nutrition education at the [facility].    Surgery date: 10/16/2020 Surgery type:sleeve from band Start weight at [facility]: 267 Weight today: pt declined    The following the learning objectives were met by the patient during this course:  Identifies Phase 3 (Soft, High Proteins) Dietary Goals and will begin from 2 weeks post-operatively to 2 months post-operatively  Identifies appropriate sources of fluids and proteins   Identifies appropriate fat sources and healthy verses unhealthy fat types    States protein recommendations and appropriate sources post-operatively  Identifies the need for appropriate texture modifications, mastication, and bite sizes when consuming solids  Identifies appropriate multivitamin and calcium sources post-operatively  Describes the need for physical activity post-operatively and will follow MD recommendations  States when to call healthcare provider regarding medication questions or post-operative complications   Handouts given during class include:  Phase 3A: Soft, High Protein Diet Handout  Phase 3 High Protein Meals  Healthy Fats   Follow-Up Plan: Patient will follow-up at [facility] in 6 weeks for 2 month post-op nutrition visit for diet advancement per MD.

## 2020-11-06 ENCOUNTER — Telehealth: Payer: Self-pay | Admitting: Skilled Nursing Facility1

## 2020-11-06 NOTE — Telephone Encounter (Signed)
RD called pt to verify fluid intake once starting soft, solid proteins 2 week post-bariatric surgery.   Daily Fluid intake: 55+ Daily Protein intake: 60g  Concerns/issues:   No concerns stated

## 2020-12-14 ENCOUNTER — Encounter: Payer: No Typology Code available for payment source | Attending: Surgery | Admitting: Skilled Nursing Facility1

## 2020-12-14 ENCOUNTER — Other Ambulatory Visit: Payer: Self-pay

## 2020-12-14 DIAGNOSIS — E669 Obesity, unspecified: Secondary | ICD-10-CM | POA: Diagnosis not present

## 2020-12-14 NOTE — Patient Instructions (Incomplete)
Paclitaxel injection What is this medicine? PACLITAXEL (PAK li TAX el) is a chemotherapy drug. It targets fast dividing cells, like cancer cells, and causes these cells to die. This medicine is used to treat ovarian cancer, breast cancer, lung cancer, Kaposi's sarcoma, and other cancers. This medicine may be used for other purposes; ask your health care Samantha [name] or pharmacist if you have questions. COMMON BRAND NAME(S): Onxol, Taxol What should I tell my health care Samantha [name] before I take this medicine? They need to know if you have any of these conditions:  history of irregular heartbeat  liver disease  low blood counts, like low white cell, platelet, or red cell counts  lung or breathing disease, like asthma  tingling of the fingers or toes, or other nerve disorder  an unusual or allergic reaction to paclitaxel, alcohol, polyoxyethylated castor oil, other chemotherapy, other medicines, foods, dyes, or preservatives  pregnant or trying to get pregnant  breast-feeding How should I use this medicine? This drug is given as an infusion into a vein. It is administered in a hospital or clinic by a specially trained health care professional. Talk to your pediatrician regarding the use of this medicine in children. Special care may be needed. Overdosage: If you think you have taken too much of this medicine contact a poison control center or emergency room at once. NOTE: This medicine is only for you. Do not share this medicine with others. What if I miss a dose? It is important not to miss your dose. Call your doctor or health care professional if you are unable to keep an appointment. What may interact with this medicine? Do not take this medicine with any of the following medications:  live virus vaccines This medicine may also interact with the following medications:  antiviral medicines for hepatitis, HIV or AIDS  certain antibiotics like erythromycin and clarithromycin  certain  medicines for fungal infections like ketoconazole and itraconazole  certain medicines for seizures like carbamazepine, phenobarbital, phenytoin  gemfibrozil  nefazodone  rifampin  St. John's wort This list may not describe all possible interactions. Give your health care Samantha [name] a list of all the medicines, herbs, non-prescription drugs, or dietary supplements you use. Also tell them if you smoke, drink alcohol, or use illegal drugs. Some items may interact with your medicine. What should I watch for while using this medicine? Your condition will be monitored carefully while you are receiving this medicine. You will need important blood work done while you are taking this medicine. This medicine can cause serious allergic reactions. To reduce your risk you will need to take other medicine(s) before treatment with this medicine. If you experience allergic reactions like skin rash, itching or hives, swelling of the face, lips, or tongue, tell your doctor or health care professional right away. In some cases, you may be given additional medicines to help with side effects. Follow all directions for their use. This drug may make you feel generally unwell. This is not uncommon, as chemotherapy can affect healthy cells as well as cancer cells. Report any side effects. Continue your course of treatment even though you feel ill unless your doctor tells you to stop. Call your doctor or health care professional for advice if you get a fever, chills or sore throat, or other symptoms of a cold or flu. Do not treat yourself. This drug decreases your body's ability to fight infections. Try to avoid being around people who are sick. This medicine may increase your risk to bruise   or bleed. Call your doctor or health care professional if you notice any unusual bleeding. Be careful brushing and flossing your teeth or using a toothpick because you may get an infection or bleed more easily. If you have any dental  work done, tell your dentist you are receiving this medicine. Avoid taking products that contain aspirin, acetaminophen, ibuprofen, naproxen, or ketoprofen unless instructed by your doctor. These medicines may hide a fever. Do not become pregnant while taking this medicine. Women should inform their doctor if they wish to become pregnant or think they might be pregnant. There is a potential for serious side effects to an unborn child. Talk to your health care professional or pharmacist for more information. Do not breast-feed an infant while taking this medicine. Men are advised not to father a child while receiving this medicine. This product may contain alcohol. Ask your pharmacist or healthcare Samantha [name] if this medicine contains alcohol. Be sure to tell all healthcare providers you are taking this medicine. Certain medicines, like metronidazole and disulfiram, can cause an unpleasant reaction when taken with alcohol. The reaction includes flushing, headache, nausea, vomiting, sweating, and increased thirst. The reaction can last from 30 minutes to several hours. What side effects may I notice from receiving this medicine? Side effects that you should report to your doctor or health care professional as soon as possible:  allergic reactions like skin rash, itching or hives, swelling of the face, lips, or tongue  breathing problems  changes in vision  fast, irregular heartbeat  high or low blood pressure  mouth sores  pain, tingling, numbness in the hands or feet  signs of decreased platelets or bleeding - bruising, pinpoint red spots on the skin, black, tarry stools, blood in the urine  signs of decreased red blood cells - unusually weak or tired, feeling faint or lightheaded, falls  signs of infection - fever or chills, cough, sore throat, pain or difficulty passing urine  signs and symptoms of liver injury like dark yellow or brown urine; general ill feeling or flu-like symptoms;  light-colored stools; loss of appetite; nausea; right upper belly pain; unusually weak or tired; yellowing of the eyes or skin  swelling of the ankles, feet, hands  unusually slow heartbeat Side effects that usually do not require medical attention (report to your doctor or health care professional if they continue or are bothersome):  diarrhea  hair loss  loss of appetite  muscle or joint pain  nausea, vomiting  pain, redness, or irritation at site where injected  tiredness This list may not describe all possible side effects. Call your doctor for medical advice about side effects. You may report side effects to FDA at 1-800-FDA-1088. Where should I keep my medicine? This drug is given in a hospital or clinic and will not be stored at home. NOTE: This sheet is a summary. It may not cover all possible information. If you have questions about this medicine, talk to your doctor, pharmacist, or health care Samantha [name].  2021 Elsevier/Gold Standard (2019-10-13 13:37:23) Carboplatin injection What is this medicine? CARBOPLATIN (KAR boe pla tin) is a chemotherapy drug. It targets fast dividing cells, like cancer cells, and causes these cells to die. This medicine is used to treat ovarian cancer and many other cancers. This medicine may be used for other purposes; ask your health care Samantha [name] or pharmacist if you have questions. COMMON BRAND NAME(S): Paraplatin What should I tell my health care Samantha [name] before I take this medicine? They need to   know if you have any of these conditions:  blood disorders  hearing problems  kidney disease  recent or ongoing radiation therapy  an unusual or allergic reaction to carboplatin, cisplatin, other chemotherapy, other medicines, foods, dyes, or preservatives  pregnant or trying to get pregnant  breast-feeding How should I use this medicine? This drug is usually given as an infusion into a vein. It is administered in a hospital or clinic by  a specially trained health care professional. Talk to your pediatrician regarding the use of this medicine in children. Special care may be needed. Overdosage: If you think you have taken too much of this medicine contact a poison control center or emergency room at once. NOTE: This medicine is only for you. Do not share this medicine with others. What if I miss a dose? It is important not to miss a dose. Call your doctor or health care professional if you are unable to keep an appointment. What may interact with this medicine?  medicines for seizures  medicines to increase blood counts like filgrastim, pegfilgrastim, sargramostim  some antibiotics like amikacin, gentamicin, neomycin, streptomycin, tobramycin  vaccines Talk to your doctor or health care professional before taking any of these medicines:  acetaminophen  aspirin  ibuprofen  ketoprofen  naproxen This list may not describe all possible interactions. Give your health care Samantha [name] a list of all the medicines, herbs, non-prescription drugs, or dietary supplements you use. Also tell them if you smoke, drink alcohol, or use illegal drugs. Some items may interact with your medicine. What should I watch for while using this medicine? Your condition will be monitored carefully while you are receiving this medicine. You will need important blood work done while you are taking this medicine. This drug may make you feel generally unwell. This is not uncommon, as chemotherapy can affect healthy cells as well as cancer cells. Report any side effects. Continue your course of treatment even though you feel ill unless your doctor tells you to stop. In some cases, you may be given additional medicines to help with side effects. Follow all directions for their use. Call your doctor or health care professional for advice if you get a fever, chills or sore throat, or other symptoms of a cold or flu. Do not treat yourself. This drug decreases  your body's ability to fight infections. Try to avoid being around people who are sick. This medicine may increase your risk to bruise or bleed. Call your doctor or health care professional if you notice any unusual bleeding. Be careful brushing and flossing your teeth or using a toothpick because you may get an infection or bleed more easily. If you have any dental work done, tell your dentist you are receiving this medicine. Avoid taking products that contain aspirin, acetaminophen, ibuprofen, naproxen, or ketoprofen unless instructed by your doctor. These medicines may hide a fever. Do not become pregnant while taking this medicine. Women should inform their doctor if they wish to become pregnant or think they might be pregnant. There is a potential for serious side effects to an unborn child. Talk to your health care professional or pharmacist for more information. Do not breast-feed an infant while taking this medicine. What side effects may I notice from receiving this medicine? Side effects that you should report to your doctor or health care professional as soon as possible:  allergic reactions like skin rash, itching or hives, swelling of the face, lips, or tongue  signs of infection - fever or   chills, cough, sore throat, pain or difficulty passing urine  signs of decreased platelets or bleeding - bruising, pinpoint red spots on the skin, black, tarry stools, nosebleeds  signs of decreased red blood cells - unusually weak or tired, fainting spells, lightheadedness  breathing problems  changes in hearing  changes in vision  chest pain  high blood pressure  low blood counts - This drug may decrease the number of white blood cells, red blood cells and platelets. You may be at increased risk for infections and bleeding.  nausea and vomiting  pain, swelling, redness or irritation at the injection site  pain, tingling, numbness in the hands or feet  problems with balance,  talking, walking  trouble passing urine or change in the amount of urine Side effects that usually do not require medical attention (report to your doctor or health care professional if they continue or are bothersome):  hair loss  loss of appetite  metallic taste in the mouth or changes in taste This list may not describe all possible side effects. Call your doctor for medical advice about side effects. You may report side effects to FDA at 1-800-FDA-1088. Where should I keep my medicine? This drug is given in a hospital or clinic and will not be stored at home. NOTE: This sheet is a summary. It may not cover all possible information. If you have questions about this medicine, talk to your doctor, pharmacist, or health care Samantha [name].  2021 Elsevier/Gold Standard (2008-02-16 14:38:05)  

## 2020-12-14 NOTE — Progress Notes (Signed)
Bariatric Nutrition Follow-Up Visit Medical Nutrition Therapy   NUTRITION ASSESSMENT    Anthropometrics    Surgery date: 10/16/2020 Surgery type:sleeve from band Start weight at [facility]: 267 Weight today: 239  Clinical  Medical hx: HTN Medications: see list Labs:   Lifestyle & Dietary Hx  Pt state she has no complaints.  Pt state she has tried the carb smart tortilla which went fine.   Estimated daily fluid intake: 64 oz Estimated daily protein intake: 60+ g Supplements: multi and hit or miss with calcium Current average weekly physical activity: stationary bike 35-40 minutes, VR boxing 5 days a week  24-Hr Dietary Recall First Meal: protein shake or scrambled eggs + cheese or lunch meat  Snack:  Second Meal: tuna salad: mayo salt pepper egg Snack: cheese Third Meal: stuffed chicken breast with cheese or scallops Snack:  Beverages: water + lemon, natures twist, hot herbal tea, water + flavoring  Post-Op Goals/ Signs/ Symptoms Using straws: no Drinking while eating: no Chewing/swallowing difficulties: no Changes in vision: no Changes to mood/headaches: no Hair loss/changes to skin/nails: no Difficulty focusing/concentrating: no Sweating: no Dizziness/lightheadedness: no Palpitations: no  Carbonated/caffeinated beverages: no N/V/D/C/Gas: no Abdominal pain: no Dumping syndrome: no    NUTRITION DIAGNOSIS  Overweight/obesity ([place]-3.3) related to past poor dietary habits and physical inactivity as evidenced by completed bariatric surgery and following dietary guidelines for continued weight loss and healthy nutrition status.     NUTRITION INTERVENTION Nutrition counseling (C-1) and education (E-2) to facilitate bariatric surgery goals, including: . Diet advancement to the next phase (phase 4) now including non starchy vegetables  . The importance of consuming adequate calories as well as certain nutrients daily due to the body's need for essential vitamins,  minerals, and fats . The importance of daily physical activity and to reach a goal of at least 150 minutes of moderate to vigorous physical activity weekly (or as directed by their physician) due to benefits such as increased musculature and improved lab values . The importance of intuitive eating specifically learning hunger-satiety cues and understanding the importance of learning a new body: The importance of mindful eating to avoid grazing behaviors   Goals: -Continue to aim for a minimum of 64 fluid ounces 7 days a week with at least 30 ounces being plain water -Eat non-starchy vegetables 2 times a day 7 days a week -Start out with soft cooked vegetables today and tomorrow; if tolerated begin to eat raw vegetables or cooked including salads -Eat your 3 ounces of protein first then start in on your non-starchy vegetables; once you understand how much of your meal leads to satisfaction and not full while still eating 3 ounces of protein and non-starchy vegetables you can eat them in any order  -Continue to aim for 30 minutes of activity at least 5 times a week -Do NOT cook with/add to your food: alfredo sauce, cheese sauce, barbeque sauce, ketchup, fat back, butter, bacon grease, grease, Crisco, OR SUGAR   Handouts Provided Include   Non starchy vegetables   Learning Style & Readiness for Change Teaching method utilized: Visual & Auditory  Demonstrated degree of understanding via: Teach Back  Readiness Level: Action Barriers to learning/adherence to lifestyle change: none identified   RD's Notes for Next Visit . Assess adherence to pt chosen goals   MONITORING & EVALUATION Dietary intake, weekly physical activity, body weight  Next Steps Patient is to follow-up in 3 months

## 2021-03-15 ENCOUNTER — Ambulatory Visit: Payer: No Typology Code available for payment source | Admitting: Skilled Nursing Facility1

## 2021-04-03 ENCOUNTER — Other Ambulatory Visit: Payer: Self-pay

## 2021-04-03 ENCOUNTER — Encounter: Payer: No Typology Code available for payment source | Attending: Surgery | Admitting: Skilled Nursing Facility1

## 2021-04-03 DIAGNOSIS — E669 Obesity, unspecified: Secondary | ICD-10-CM | POA: Diagnosis present

## 2021-04-05 NOTE — Progress Notes (Signed)
Follow-up visit  Medical Nutrition Therapy:  Appt start time: 6:00pm end time:  7:00pm  Primary concerns today: Post-operative Bariatric Surgery Nutrition Management 6 Month Post-Op Class  Surgery date: 10/16/2020 Surgery type:sleeve from band Start weight at [facility]: 267 Weight today: 206.2   Body Composition Scale 04/03/2021  Weight  lbs 206.2  Total Body Fat  % 36.2     Visceral Fat   Fat-Free Mass  %      Total Body Water  % 46.3     Muscle-Mass  lbs   BMI   Body Fat Displacement ---        Torso  lbs         Left Leg  lbs         Right Leg  lbs         Left Arm  lbs         Right Arm  lbs      Information Reviewed/ Discussed During Appointment: -Review of composition scale numbers -Fluid requirements (64-100 ounces) -Protein requirements (60-80g) -Strategies for tolerating diet -Advancement of diet to include Starchy vegetables -Barriers to inclusion of new foods -Inclusion of appropriate multivitamin and calcium supplements  -Exercise recommendations   Fluid intake: adequate   Medications: See List Supplementation: appropriate    Using straws: no Drinking while eating: no Having you been chewing well: yes Chewing/swallowing difficulties: no Changes in vision: no Changes to mood/headaches: no Hair loss/Cahnges to skin/Changes to nails: no Any difficulty focusing or concentrating: no Sweating: no Dizziness/Lightheaded: no Palpitations: no  Carbonated beverages: no N/V/D/C/GAS: no Abdominal Pain: no Dumping syndrome: no  Recent physical activity:  ADL's  Progress Towards Goal(s):  In Progress Teaching method utilized: Visual & Auditory  Demonstrated degree of understanding via: Teach Back  Readiness Level: Action Barriers to learning/adherence to lifestyle change: none identified  Handouts given during visit include:  Phase V diet Progression   Goals Sheet  The Benefits of Exercise are endless.....  Support Group Topics  Teaching Method  Utilized:  Visual Auditory Hands on  Demonstrated degree of understanding via:  Teach Back   Monitoring/Evaluation:  Dietary intake, exercise, and body weight. Follow up in 3 months for 9 month post-op visit.

## 2021-07-02 ENCOUNTER — Encounter: Payer: No Typology Code available for payment source | Attending: Surgery | Admitting: Skilled Nursing Facility1

## 2021-07-02 ENCOUNTER — Other Ambulatory Visit: Payer: Self-pay

## 2021-07-02 DIAGNOSIS — E669 Obesity, unspecified: Secondary | ICD-10-CM | POA: Insufficient documentation

## 2021-07-02 NOTE — Progress Notes (Signed)
Follow-up visit  Medical Nutrition Therapy   Primary concerns today: Post-operative Bariatric Surgery Nutrition Management 6 Month Post-Op Class  Surgery date: 10/16/2020 Surgery type:sleeve from band Start weight at [facility]: 267 Weight today: 191.5 pounds   Body Composition Scale 04/03/2021 07/02/2021  Weight  lbs 206.2 191.5  Total Body Fat  % 36.2 33     Visceral Fat  7  Fat-Free Mass  %  66.9     Total Body Water  % 46.3 47.9     Muscle-Mass  lbs  35.9  BMI  26.8  Body Fat Displacement ---         Torso  lbs  39.1        Left Leg  lbs  7.8        Right Leg  lbs  7.8        Left Arm  lbs  3.9        Right Arm  lbs  3.9    Pt states she did get an implant in her ear but is still getting used to it, stating it will take a couple years to acclimate. Pt states  Pt sates she is happy with her current weight and does not want to lose any more weight also stating she is interested in skin removal around her midsection and her arms.  Pt states she takes an acid reducer daily, stating without it she will have horrible reflux.   24 hr recall Breakfast: egg + cheese + butter on low carb tortilla Snack: Lunch: [place] wrap +  lettuce  + tomato  Snack: Dinner: Chicken salad  Fluid intake: adequate: water  Medications: See List Supplementation: multi and 1 calcium (forgets to take the other 2)   Using straws: no Drinking while eating: no Having you been chewing well: yes Chewing/swallowing difficulties: no Changes in vision: no Changes to mood/headaches: no Hair loss/Cahnges to skin/Changes to nails: no Any difficulty focusing or concentrating: no Sweating: no Dizziness/Lightheaded: no Palpitations: no  Carbonated beverages: no N/V/D/C/GAS: no Abdominal Pain: no Dumping syndrome: no  Recent physical activity:  Gym and walking and stationary bike 2 hours 5 days a week  Progress Towards Goal(s):  In Progress Teaching method utilized: Patent attorney & Auditory  Demonstrated  degree of understanding via: Teach Back  Readiness Level: Action Barriers to learning/adherence to lifestyle change: none identified   Teaching Method Utilized:  Visual Auditory Hands on  Demonstrated degree of understanding via:  Teach Back   Monitoring/Evaluation:  Dietary intake, exercise, and body weight. Follow up in 3 months for 9 month post-op visit.

## 2021-10-04 ENCOUNTER — Ambulatory Visit: Payer: No Typology Code available for payment source | Admitting: Skilled Nursing Facility1

## 2021-10-22 ENCOUNTER — Encounter: Payer: No Typology Code available for payment source | Admitting: Skilled Nursing Facility1

## 2021-12-26 ENCOUNTER — Other Ambulatory Visit: Payer: Self-pay

## 2021-12-26 ENCOUNTER — Ambulatory Visit (INDEPENDENT_AMBULATORY_CARE_PROVIDER_SITE_OTHER): Payer: No Typology Code available for payment source | Admitting: Plastic Surgery

## 2021-12-26 ENCOUNTER — Encounter: Payer: Self-pay | Admitting: Plastic Surgery

## 2021-12-26 VITALS — BP 153/99 | HR 79 | Ht 70.0 in | Wt 181.4 lb

## 2021-12-26 DIAGNOSIS — M545 Low back pain, unspecified: Secondary | ICD-10-CM

## 2021-12-26 DIAGNOSIS — M546 Pain in thoracic spine: Secondary | ICD-10-CM

## 2021-12-26 DIAGNOSIS — N62 Hypertrophy of breast: Secondary | ICD-10-CM | POA: Diagnosis not present

## 2021-12-26 DIAGNOSIS — Z411 Encounter for cosmetic surgery: Secondary | ICD-10-CM | POA: Diagnosis not present

## 2021-12-26 DIAGNOSIS — M4004 Postural kyphosis, thoracic region: Secondary | ICD-10-CM

## 2021-12-26 DIAGNOSIS — M793 Panniculitis, unspecified: Secondary | ICD-10-CM

## 2021-12-26 NOTE — Progress Notes (Signed)
Referring Provider Maud Deed, PA [address] [place],  [place] [address]   CC:  Chief Complaint  Patient presents with   Consult      Samantha [name] is an 51 y.o. female.  HPI: Patient presents to discuss breast reduction.  She has had years of back pain, neck pain and shoulder grooving related to her large breast.  She is tried over-the-counter medications, warm packs, cold packs and supportive bras with little relief.  She also gets rashes beneath her breast that been refractory to over-the-counter treatments.  No previous breast biopsies or procedures and no family history of breast cancer.  She does not smoke and is not diabetic.  She is currently a D cup and would be happy with a C cup.  Patient also wants to discuss her abdominal contour.  She has had bariatric surgery initially with a lap band which was revised to a gastric sleeve.  She is lost overall 100 pounds.  Only other abdominal surgery is a C-section in 1997.  She does get rashes beneath the excess skin that been refractory to over-the-counter treatments.  No Known Allergies  Outpatient Encounter Medications as of 12/26/2021  Medication Sig Note   DEPO-SUBQ PROVERA 104 104 MG/0.65ML injection Inject 104 mg into the skin every 3 (three) months. 09/26/2020: Last dose:06/2020   lisinopril-hydrochlorothiazide (ZESTORETIC) 20-12.5 MG tablet Take 1 tablet by mouth daily.    [DISCONTINUED] pantoprazole (PROTONIX) 40 MG tablet TAKE 1 TABLET (40 MG TOTAL) BY MOUTH DAILY.    No facility-administered encounter medications on file as of 12/26/2021.     Past Medical History:  Diagnosis Date   Arthritis    Contact lens/glasses fitting    History of swelling of feet    Hypertension     Past Surgical History:  Procedure Laterality Date   CESAREAN SECTION  1997   LAPAROSCOPIC GASTRIC BANDING  02/23/2008   LAPAROSCOPIC REPAIR AND REMOVAL OF GASTRIC BAND N/A 10/16/2020   Procedure: CONVERSION FROM LAP  BAND TO LAPAROSCOPIC GASTRIC SLEEVE - REMOVAL LAP BAND AND COMPONENTS;  Surgeon: Luretha Murphy, MD;  Location: [facility];  Service: General;  Laterality: N/A;   UPPER GI ENDOSCOPY N/A 10/16/2020   Procedure: UPPER GI ENDOSCOPY;  Surgeon: Luretha Murphy, MD;  Location: [facility];  Service: General;  Laterality: N/A;    Family History  Problem Relation Age of Onset   Hypertension Mother    Hypertension Father     Social History   Social History Narrative   Not on file     Review of Systems General: Denies fevers, chills, weight loss CV: Denies chest pain, shortness of breath, palpitations  Physical Exam Vitals with BMI 12/26/2021 07/02/2021 04/05/2021  Height 5\' 10"  - -  Weight 181 lbs 6 oz 191 lbs 8 oz 206 lbs 3 oz  BMI 26.03 - -  Systolic 153 - -  Diastolic 99 - -  Pulse 79 - -    General:  No acute distress,  Alert and oriented, Non-Toxic, Normal speech and affect Breast: She has grade 3 ptosis.  Sternal notch to nipple is 33 cm bilaterally.  Nipple to fold 14 cm bilaterally.  No obvious scars or masses. Abdomen: Abdomen soft nontender.  No obvious hernias.  Well-healed laparoscopic incisions in the C-section incision.  Moderate to severe excess skin with minimal excess adipose tissue.  Assessment/Plan The patient has bilateral symptomatic macromastia.  She is a good candidate for a breast reduction.  She is  interested in pursuing surgical treatment.  She has tried supportive garments and fitted bras with no relief.  The details of breast reduction surgery were discussed.  I explained the procedure in detail along the with the expected scars.  The risks were discussed in detail and include bleeding, infection, damage to surrounding structures, need for additional procedures, nipple loss, change in nipple sensation, persistent pain, contour irregularities and asymmetries.  I explained that breast feeding is often not possible after breast reduction surgery.  We discussed the expected  postoperative course with an overall recovery period of about 1 month.  She demonstrated full understanding of all risks.  We discussed her personal risk factors.  The patient is interested in pursuing surgical treatment.  I anticipate approximately 650g of tissue removed from each side.  Regarding the abdomen I think she is a good candidate for contouring in this area.  She is symptomatic which I think would justify an infraumbilical panniculectomy.  She would also benefit from upper abdominal liposuction and plication and removal of excess skin in that area as well.  We discussed the risk and benefits of the procedure that include bleeding, infection, damage surrounding structures need for additional procedures.  We discussed the anticipated recovery time.  I do think she would be interested in doing these together which would be reasonable.  All of her questions were answered.   [name] 12/26/2021, 3:51 PM

## 2022-01-31 ENCOUNTER — Telehealth: Payer: Self-pay

## 2022-01-31 NOTE — Telephone Encounter (Signed)
Calling to say she received a denial letter from her insurance company.  She said it was denied because we didn't send her mammogram results with the submission.   ?

## 2022-02-06 ENCOUNTER — Telehealth: Payer: Self-pay | Admitting: Plastic Surgery

## 2022-02-06 NOTE — Telephone Encounter (Signed)
Patient requested call back regarding partial decision from insurance. Patient's insurance approved panniculectomy and denied breast reduction because no mmg or other cancer screening submitted to show medical necessity. Patient had mmg done in Aug of 2022 but had not signed release for [place] to obtain results. Requested that she have the office forward the results or that she can forward the mmg results to [place] for [place] to begin appeal on the breast reduction part and after we receive the decision on that part, we can move forward with scheduling both procedures or just the one surgery. Patient understands and will work on getting [place] updated mmg.  ?

## 2022-04-04 ENCOUNTER — Telehealth: Payer: Self-pay

## 2022-04-04 NOTE — Telephone Encounter (Signed)
Called patient to touch base with appeal. She went on line, printed form and will send it to [place] today. We will fax that along with her MMG from 2022. Panniculectomy was only approved for 1 day. We will resubmit that as well to her insurance company ?

## 2022-06-05 ENCOUNTER — Telehealth: Payer: Self-pay

## 2022-06-05 NOTE — Telephone Encounter (Addendum)
Returned patients call. She received a letter today dated 05/23/2022 indicating both surgeries were approved. Previously letter dated 01/25/22 indicating she was approved for panniculectomy surgery and not breast reduction showing approved for one day  with a date range 01/16/22-01/16/22. An appeal was sent as well. Advised we will have to resubmit everything due to the expired date. She is scheduled with Dr. Domenica Reamer on 07/01/2022.  Called [employer], Jeanie Cooks provided new pre pending authorization [id number]. Faxed to [phone]. Patient is aware of resubmision.

## 2022-07-01 ENCOUNTER — Encounter: Payer: Self-pay | Admitting: Plastic Surgery

## 2022-07-01 ENCOUNTER — Ambulatory Visit (INDEPENDENT_AMBULATORY_CARE_PROVIDER_SITE_OTHER): Payer: No Typology Code available for payment source | Admitting: Plastic Surgery

## 2022-07-01 VITALS — BP 133/85 | HR 78 | Ht 70.0 in | Wt 179.8 lb

## 2022-07-01 DIAGNOSIS — M546 Pain in thoracic spine: Secondary | ICD-10-CM | POA: Diagnosis not present

## 2022-07-01 DIAGNOSIS — R21 Rash and other nonspecific skin eruption: Secondary | ICD-10-CM

## 2022-07-01 DIAGNOSIS — M793 Panniculitis, unspecified: Secondary | ICD-10-CM

## 2022-07-01 DIAGNOSIS — M545 Low back pain, unspecified: Secondary | ICD-10-CM | POA: Diagnosis not present

## 2022-07-01 DIAGNOSIS — N62 Hypertrophy of breast: Secondary | ICD-10-CM

## 2022-07-01 DIAGNOSIS — Z411 Encounter for cosmetic surgery: Secondary | ICD-10-CM

## 2022-07-01 DIAGNOSIS — M4004 Postural kyphosis, thoracic region: Secondary | ICD-10-CM

## 2022-07-01 NOTE — Progress Notes (Signed)
   Referring Provider Maud Deed, PA [address] [place],  [place] [address]   CC:  Breast hypertrophy and panniculitis  CLEOPATRA SARDO is an 51 y.o. female.  HPI: Patient is a 51 year old who has previously been seen in the practice for breast hypertrophy as well as panniculitis.  She brought with her approval from her insurance company to have abdominal panniculectomy as well as bilateral breast reduction.  I think that she will have a decent chance that breast reduction will help with her rashes under her breast as well as her back pain.  She will also get benefit from abdominal panniculectomy in terms of improvement of her rashes.  We discussed additional cosmetic benefit if we perform surgery above the umbilicus and plication.  Review of Systems General: No fever or chills  Physical Exam    07/01/2022   11:21 AM 12/26/2021   12:46 PM 07/02/2021    4:09 PM  Vitals with BMI  Height 5\' 10"  5\' 10"    Weight 179 lbs 13 oz 181 lbs 6 oz 191 lbs 8 oz  BMI 25.8 26.03   Systolic 133 153   Diastolic 85 99   Pulse 78 79     General:  No acute distress,  Alert and oriented, Non-Toxic, Normal speech and affect Breast: She has grade 3 ptosis.  Sternal notch to nipple is 33 cm bilaterally.  Nipple to fold 14 cm bilaterally.  No obvious scars or masses. Abdomen: Abdomen soft nontender.  No obvious hernias.  Well-healed laparoscopic incisions in the C-section incision.  Moderate to severe excess skin with minimal excess adipose tissue.  Patient does have significant excess skin above the umbilicus.  Assessment/Plan 1) reduction to remove approximately 550 to 650 g on each breast is likely to improve back pain and rashes under her breast 2) goal of abdominal pannus is likely to improve her rashes that she has below her abdominal pannus 3) discussed additional cosmetic benefit if the patient has transposition of the umbilicus, fascial plication and removal of additional  skin above the umbilicus.  Given that she has significant skin above the umbilicus my recommendation is for her to have this in addition to her panniculectomy.  07/01/2022, 12:52 PM

## 2022-07-02 ENCOUNTER — Encounter: Payer: Self-pay | Admitting: *Deleted

## 2022-07-09 ENCOUNTER — Telehealth: Payer: Self-pay | Admitting: Plastic Surgery

## 2022-08-05 ENCOUNTER — Telehealth: Payer: Self-pay | Admitting: *Deleted

## 2022-08-05 NOTE — Telephone Encounter (Signed)
Contacted [employer] to extend auth dates as patients surgery is scheduled after the auth was set to expire.  Berkley Avery [id number] now valid until 10/24/22  Rep: Birder Robson Phone: [phone] Ref: [phone]

## 2022-08-23 ENCOUNTER — Telehealth: Payer: Self-pay | Admitting: *Deleted

## 2022-08-23 NOTE — Telephone Encounter (Signed)
Trying to reach patient ot see if we can move  her up to 10/26.

## 2022-08-30 ENCOUNTER — Telehealth: Payer: Self-pay | Admitting: *Deleted

## 2022-08-30 NOTE — Telephone Encounter (Signed)
Received a call from [place] [phone]) stating patient had no AMB benefits. Contacted [facility] - [phone]) confirmed patient can have outpatient surgery at an ambulatory surgery center or outpatient hospital so she can have her surgery at [facility]. Contacted [employer] Auth dept [phone] amcst) who corrected the facility on the auth to [place] [facility]. Called [place] @ [facility] back and left message.

## 2022-09-09 NOTE — Progress Notes (Signed)
Patient ID: Samantha [name], female    DOB: April 25, 1971, 51 y.o.   MRN: [id number]  Chief Complaint  Patient presents with   Pre-op Exam      ICD-10-CM   1. Back pain of thoracolumbar region  M54.50    M54.6     2. Encounter for cosmetic procedure  Z41.1     3. Macromastia  N62     4. Panniculitis  M79.3     5. Postural kyphosis, thoracic region  M40.04       History of Present Illness: Samantha [name] is a 51 y.o.  female  with a history of macromastia.  She presents for preoperative evaluation for upcoming procedure, Bilateral Breast Reduction and panniculectomy with upper abdominoplasty add-on, scheduled for 09/19/2022 with Dr.  Erin Hearing  The patient has not had problems with anesthesia. No history of DVT/PE.  No family history of DVT/PE.  No family or personal history of bleeding or clotting disorders.  Patient is not currently taking any blood thinners.  No history of CVA/MI.   Summary of Previous Visit: STN bilaterally is 33 cm  Estimated excess breast tissue to be removed at time of surgery: 550-650 grams  Job: Works at the post office, discussed 6 weeks out of work  [place] Significant for: Macromastia, panniculitis, hypertension.  Patient reports she is feeling well, no recent changes to her health.  She reports that she would like to be about a C cup.  She does not have any specific questions about surgery.    Past Medical History: Allergies: No Known Allergies  Current Medications:  Current Outpatient Medications:    lisinopril (ZESTRIL) 20 MG tablet, Take 20 mg by mouth daily., Disp: , Rfl:    ondansetron (ZOFRAN) 4 MG tablet, Take 1 tablet (4 mg total) by mouth every 8 (eight) hours as needed for nausea or vomiting., Disp: 20 tablet, Rfl: 0   oxyCODONE (OXY IR/ROXICODONE) 5 MG immediate release tablet, Take 1 tablet (5 mg total) by mouth every 6 (six) hours as needed for up to 5 days for severe pain., Disp: 20 tablet, Rfl: 0   pantoprazole (PROTONIX) 40 MG  tablet, Take 40 mg by mouth daily., Disp: , Rfl:    PREVIDENT 5000 BOOSTER PLUS 1.1 % PSTE, Place onto teeth 2 (two) times daily., Disp: , Rfl:   Past Medical Problems: Past Medical History:  Diagnosis Date   Arthritis    Contact lens/glasses fitting    History of swelling of feet    Hypertension     Past Surgical History: Past Surgical History:  Procedure Laterality Date   CESAREAN SECTION  1997   LAPAROSCOPIC GASTRIC BANDING  02/23/2008   LAPAROSCOPIC REPAIR AND REMOVAL OF GASTRIC BAND N/A 10/16/2020   Procedure: CONVERSION FROM LAP BAND TO LAPAROSCOPIC GASTRIC SLEEVE - REMOVAL LAP BAND AND COMPONENTS;  Surgeon: Johnathan Hausen, MD;  Location: [facility];  Service: General;  Laterality: N/A;   UPPER GI ENDOSCOPY N/A 10/16/2020   Procedure: UPPER GI ENDOSCOPY;  Surgeon: Johnathan Hausen, MD;  Location: [facility];  Service: General;  Laterality: N/A;    Social History: Social History   Socioeconomic History   Marital status: Single    Spouse name: Not on file   Number of children: Not on file   Years of education: Not on file   Highest education level: Not on file  Occupational History   Not on file  Tobacco Use   Smoking status: Never   Smokeless  tobacco: Never  Vaping Use   Vaping Use: Never used  Substance and Sexual Activity   Alcohol use: Yes    Comment: ocassionally   Drug use: No   Sexual activity: Yes  Other Topics Concern   Not on file  Social History Narrative   Not on file   Social Determinants of Health   Financial Resource Strain: Not on file  Food Insecurity: Not on file  Transportation Needs: Not on file  Physical Activity: Not on file  Stress: Not on file  Social Connections: Not on file  Intimate Partner Violence: Not on file    Family History: Family History  Problem Relation Age of Onset   Hypertension Mother    Hypertension Father     Review of Systems: Review of Systems  Constitutional: Negative.   Respiratory: Negative.     Cardiovascular: Negative.   Gastrointestinal: Negative.   Neurological: Negative.     Physical Exam: Vital Signs BP 123/84 (BP Location: Left Arm, Patient Position: Sitting, Cuff Size: Large)   Pulse 81   Ht 5\' 10"  (1.778 m)   Wt 179 lb 9.6 oz (81.5 kg)   SpO2 100%   BMI 25.77 kg/m   Physical Exam Constitutional:      General: Not in acute distress.    Appearance: Normal appearance. Not ill-appearing.  HENT:     Head: Normocephalic and atraumatic.  Eyes:     Pupils: Pupils are equal, round Neck:     Musculoskeletal: Normal range of motion.  Cardiovascular:     Rate and Rhythm: Normal rate    Pulses: Normal pulses.  Pulmonary:     Effort: Pulmonary effort is normal. No respiratory distress.  Musculoskeletal: Normal range of motion.  Skin:    General: Skin is warm and dry.     Findings: No erythema or rash.  Neurological:     General: No focal deficit present.     Mental Status: Alert and oriented to person, place, and time. Mental status is at baseline.     Motor: No weakness.  Psychiatric:        Mood and Affect: Mood normal.        Behavior: Behavior normal.    Assessment/Plan: The patient is scheduled for bilateral breast reduction and panniculectomy with upper abdominoplasty add-on with Dr.  Erin Hearing .  Risks, benefits, and alternatives of procedure discussed, questions answered and consent obtained.    Smoking Status: Non-smoker; Counseling Given?  N/A Last Mammogram: Patient scheduled for mammogram Thursday, 09/12/2022; Results: N/A  Caprini Score: 4, moderate; Risk Factors include: Age, BMI > 25, and length of planned surgery. Recommendation for mechanical prophylaxis. Encourage early ambulation.   Pictures obtained: @consult  and Pictures were obtained of the patient and placed in the chart with the patient's or guardian's permission.  Post-op Rx sent to pharmacy: Oxycodone, Zofran  Patient was provided with the breast reduction and General Surgical Risk  consent document and Pain Medication Agreement prior to their appointment.  They had adequate time to read through the risk consent documents and Pain Medication Agreement. We also discussed them in person together during this preop appointment. All of their questions were answered to their satisfaction.  Recommended calling if they have any further questions.  Risk consent form and Pain Medication Agreement to be scanned into patient's chart.  The risk that can be encountered with breast reduction were discussed and include the following but not limited to these:  Breast asymmetry, fluid accumulation, firmness of the  breast, inability to breast feed, loss of nipple or areola, skin loss, decrease or no nipple sensation, fat necrosis of the breast tissue, bleeding, infection, healing delay.  There are risks of anesthesia, changes to skin sensation and injury to nerves or blood vessels.  The muscle can be temporarily or permanently injured.  You may have an allergic reaction to tape, suture, glue, blood products which can result in skin discoloration, swelling, pain, skin lesions, poor healing.  Any of these can lead to the need for revisonal surgery or stage procedures.  A reduction has potential to interfere with diagnostic procedures.  Nipple or breast piercing can increase risks of infection.  This procedure is best done when the breast is fully developed.  Changes in the breast will continue to occur over time.  Pregnancy can alter the outcomes of previous breast reduction surgery, weight gain and weigh loss can also effect the long term appearance.   The risk that can be encountered for this procedure were discussed and include the following but not limited to these: asymmetry, fluid accumulation, firmness of the tissue, skin loss, decrease or no sensation, fat necrosis, bleeding, infection, healing delay.  Deep vein thrombosis, cardiac and pulmonary complications are risks to any procedure.  There are risks  of anesthesia, changes to skin sensation and injury to nerves or blood vessels.  The muscle can be temporarily or permanently injured.  You may have an allergic reaction to tape, suture, glue, blood products which can result in skin discoloration, swelling, pain, skin lesions, poor healing.  Any of these can lead to the need for revisonal surgery or stage procedures.  Weight gain and weigh loss can also effect the long term appearance. The results are not guaranteed to last a lifetime.  Future surgery may be required.    The risks that can be encountered with and after liposuction were discussed and include the following but no limited to these:  Asymmetry, fluid accumulation, firmness of the area, fat necrosis with death of fat tissue, bleeding, infection, delayed healing, anesthesia risks, skin sensation changes, injury to structures including nerves, blood vessels, and muscles which may be temporary or permanent, allergies to tape, suture materials and glues, blood products, topical preparations or injected agents, skin and contour irregularities, skin discoloration and swelling, deep vein thrombosis, cardiac and pulmonary complications, pain, which may persist, persistent pain, recurrence of the lesion, poor healing of the incision, possible need for revisional surgery or staged procedures. Thiere can also be persistent swelling, poor wound healing, rippling or loose skin, worsening of cellulite, swelling, and thermal burn or heat injury from ultrasound with the ultrasound-assisted lipoplasty technique. Any change in weight fluctuations can alter the outcome.   Electronically signed by: Carola Rhine Jeniah Kishi, PA-C 09/10/2022 9:39 AM

## 2022-09-10 ENCOUNTER — Other Ambulatory Visit: Payer: Self-pay | Admitting: Physician Assistant

## 2022-09-10 ENCOUNTER — Ambulatory Visit (INDEPENDENT_AMBULATORY_CARE_PROVIDER_SITE_OTHER): Payer: No Typology Code available for payment source | Admitting: Surgical

## 2022-09-10 ENCOUNTER — Encounter: Payer: Self-pay | Admitting: Surgical

## 2022-09-10 VITALS — BP 123/84 | HR 81 | Ht 70.0 in | Wt 179.6 lb

## 2022-09-10 DIAGNOSIS — M545 Low back pain, unspecified: Secondary | ICD-10-CM

## 2022-09-10 DIAGNOSIS — M793 Panniculitis, unspecified: Secondary | ICD-10-CM

## 2022-09-10 DIAGNOSIS — Z411 Encounter for cosmetic surgery: Secondary | ICD-10-CM

## 2022-09-10 DIAGNOSIS — M546 Pain in thoracic spine: Secondary | ICD-10-CM

## 2022-09-10 DIAGNOSIS — N62 Hypertrophy of breast: Secondary | ICD-10-CM

## 2022-09-10 DIAGNOSIS — M4004 Postural kyphosis, thoracic region: Secondary | ICD-10-CM

## 2022-09-10 DIAGNOSIS — Z1231 Encounter for screening mammogram for malignant neoplasm of breast: Secondary | ICD-10-CM

## 2022-09-10 MED ORDER — ONDANSETRON HCL 4 MG PO TABS: 4.0000 mg | ORAL_TABLET | Freq: Three times a day (TID) | ORAL | 0 refills | Status: AC | PRN

## 2022-09-10 MED ORDER — OXYCODONE HCL 5 MG PO TABS: 5.0000 mg | ORAL_TABLET | Freq: Four times a day (QID) | ORAL | 0 refills | Status: AC | PRN

## 2022-09-11 DIAGNOSIS — Z719 Counseling, unspecified: Secondary | ICD-10-CM

## 2022-09-19 DIAGNOSIS — M793 Panniculitis, unspecified: Secondary | ICD-10-CM | POA: Diagnosis not present

## 2022-09-19 DIAGNOSIS — Z411 Encounter for cosmetic surgery: Secondary | ICD-10-CM

## 2022-09-19 DIAGNOSIS — N62 Hypertrophy of breast: Secondary | ICD-10-CM | POA: Diagnosis not present

## 2022-09-26 ENCOUNTER — Ambulatory Visit (INDEPENDENT_AMBULATORY_CARE_PROVIDER_SITE_OTHER): Payer: No Typology Code available for payment source | Admitting: Physician Assistant

## 2022-09-26 DIAGNOSIS — Z9889 Other specified postprocedural states: Secondary | ICD-10-CM

## 2022-09-26 NOTE — Progress Notes (Signed)
Patient is a very pleasant 51 year old female with PMH of macromastia and recurrent panniculitis s/p bilateral breast reduction and abdominoplasty performed 09/19/2022 by Dr. Erin Hearing who presents to clinic for postoperative follow-up.  Today, patient is doing well.  She states that she has had minimal drain output from her drains for the past 3 days, less than 20 cc daily on each side.  She tells me that her pain is well controlled and she is quite pleased with her surgical outcome.  She has been flexing at the hip when sleeping at night and keeping regular compression on her abdomen and breasts.  No complaints.  Specifically, denies any fevers, chest pain, difficulty breathing, leg swelling, or other symptoms.  Physical exam entirely reassuring.  Breasts with excellent shape and symmetry.  Soft, no seroma or hematoma.  There is some ecchymoses inferiorly to the breast bilaterally, suspect from upper abdominal liposuction.  Areas are nontender, no asymmetric swelling.  Abdomen is soft, nondistended.  All of the overlying bordered Mepilex dressings are removed.  Steri-Strips all remain firmly intact.  JP drains are intact and functional with normal-appearing drainage in bulbs bilaterally.  Drains removed without complication or difficulty.  Continued activity modifications and compressive garments.  We will likely remove Steri-Strips at subsequent encounter and take photos at that time.  She is doing well, continue with compression garments and activity modifications.  I informed patient that she can transition to compressive bra and post tummy tuck compressive garments.  Return in 2 weeks, she can certainly call the clinic should she have any questions or concerns in interim.

## 2022-10-01 ENCOUNTER — Telehealth: Payer: Self-pay | Admitting: Plastic Surgery

## 2022-10-01 NOTE — Telephone Encounter (Signed)
LVM and sent my chart message regarding provider fee of $2100.00 owed for her surgery 09/10/22 that was not covered by insurance since it was considered cosmetic to insurance. Quote sent along with My Chart message.

## 2022-10-02 ENCOUNTER — Telehealth: Payer: Self-pay | Admitting: Plastic Surgery

## 2022-10-02 NOTE — Telephone Encounter (Signed)
Pt called in and was put on hold for a moment, when call was answered, you could hear her in the background.  Attempted to call out for her multiple times and held on but to no answer.  Hung up and called her back but had to leave a voicemail to return our call.

## 2022-10-11 ENCOUNTER — Encounter: Payer: No Typology Code available for payment source | Admitting: Surgical

## 2022-10-15 NOTE — Progress Notes (Unsigned)
Patient is a very pleasant 51 year old female with PMH of macromastia and recurrent panniculitis s/p bilateral breast reduction and abdominoplasty performed 09/19/2022 by Dr. Domenica Reamer who presents to clinic for postoperative follow-up.   She was last seen here in clinic on 09/26/2022.  At that time, drains removed without complication or difficulty.  Exam was entirely reassuring.  Plan remove Steri-Strips and take photos at subsequent encounter.  Continued activity modifications and compressive garments in interim. Today,

## 2022-10-16 ENCOUNTER — Ambulatory Visit (INDEPENDENT_AMBULATORY_CARE_PROVIDER_SITE_OTHER): Payer: No Typology Code available for payment source | Admitting: Physician Assistant

## 2022-10-16 ENCOUNTER — Encounter: Payer: Self-pay | Admitting: Physician Assistant

## 2022-10-16 VITALS — BP 117/80 | HR 74

## 2022-10-16 DIAGNOSIS — Z9889 Other specified postprocedural states: Secondary | ICD-10-CM

## 2022-10-24 ENCOUNTER — Ambulatory Visit: Payer: No Typology Code available for payment source | Admitting: Plastic Surgery

## 2022-10-25 ENCOUNTER — Ambulatory Visit: Payer: No Typology Code available for payment source | Admitting: Plastic Surgery

## 2022-10-29 NOTE — Progress Notes (Signed)
Patient is a very pleasant 51 year old female with PMH of macromastia and recurrent panniculitis s/p bilateral breast reduction and abdominoplasty performed 09/19/2022 by Dr. Domenica Reamer who presents to clinic for postoperative follow-up.   She was last seen here in clinic on 10/16/2022.  At that time, Steri-Strips were removed and exam is currently reassuring.  Little bit of firmness in the suprapubic region and centrally near the abdominoplasty incision, but suspect it would likely soften.  Umbilicus was viable, albeit dry.  Recommended Vaseline to incisions throughout including umbilicus.  No evidence of seroma or hematoma on exam.  Today, patient is doing well.  She states that she has headache intermittently and also feels cold at times.  She states that her OB/GYN checked her hemoglobin and told her that she was anemic, reports that it is in the 10s.  Cannot verify in EMR.  They did not prescribe her any iron supplementation and instead just informed her that it was likely due to her surgery.  She is 6 weeks postop.  Otherwise, she states that she is doing well.  She has been applying Vaseline to all of her incisions and feels as though she has healed nicely.  Physical exam is entirely reassuring.  Abdomen is soft, nontender.  Incision CDI.  No residual sutures.  Some overlying scabbing that is debrided here.  Umbilicus is healthy appearing, viable.  Breasts are also soft, nontender.  NAC's are viable.  Incisions well-healed.  She has healed quite nicely and has had an excellent outcome.  She is pleased.  As for her headaches and feeling cold, advised her to follow-up with her PCP.  She can consider over-the-counter ferrous sulfate for a couple of weeks to see if that provides any improvement.  However, informed her that the differential for headaches and feeling cold in December is vast. Do not feel as though they are reflective of any surgery.  She has healed nicely and without complication.  Follow-up  only as needed.  Picture(s) obtained of the patient and placed in the chart were with the patient's or guardian's permission.

## 2022-10-30 ENCOUNTER — Ambulatory Visit (INDEPENDENT_AMBULATORY_CARE_PROVIDER_SITE_OTHER): Payer: No Typology Code available for payment source | Admitting: Physician Assistant

## 2022-10-30 ENCOUNTER — Encounter: Payer: Self-pay | Admitting: Physician Assistant

## 2022-10-30 VITALS — BP 123/85 | HR 86 | Ht 70.0 in | Wt 172.4 lb

## 2022-10-30 DIAGNOSIS — Z9889 Other specified postprocedural states: Secondary | ICD-10-CM

## 2022-10-30 NOTE — Telephone Encounter (Signed)
error 

## 2022-11-01 DIAGNOSIS — Z719 Counseling, unspecified: Secondary | ICD-10-CM

## 2023-05-26 ENCOUNTER — Encounter (HOSPITAL_COMMUNITY): Payer: Self-pay | Admitting: *Deleted

## 2024-10-29 ENCOUNTER — Encounter: Payer: Self-pay | Admitting: Gastroenterology

## 2024-12-09 ENCOUNTER — Encounter: Payer: Self-pay | Admitting: Gastroenterology

## 2024-12-09 ENCOUNTER — Ambulatory Visit: Admitting: Gastroenterology

## 2024-12-09 VITALS — BP 120/80 | HR 69 | Ht 70.0 in | Wt 192.0 lb

## 2024-12-09 DIAGNOSIS — K219 Gastro-esophageal reflux disease without esophagitis: Secondary | ICD-10-CM | POA: Diagnosis not present

## 2024-12-09 DIAGNOSIS — Z860100 Personal history of colon polyps, unspecified: Secondary | ICD-10-CM

## 2024-12-09 DIAGNOSIS — K581 Irritable bowel syndrome with constipation: Secondary | ICD-10-CM

## 2024-12-09 DIAGNOSIS — K589 Irritable bowel syndrome without diarrhea: Secondary | ICD-10-CM

## 2024-12-09 DIAGNOSIS — Z9884 Bariatric surgery status: Secondary | ICD-10-CM

## 2024-12-09 DIAGNOSIS — R1013 Epigastric pain: Secondary | ICD-10-CM

## 2024-12-09 MED ORDER — PANTOPRAZOLE SODIUM 40 MG PO TBEC: 40.0000 mg | DELAYED_RELEASE_TABLET | Freq: Two times a day (BID) | ORAL | 3 refills | Status: AC

## 2024-12-09 NOTE — Patient Instructions (Signed)
 We have sent the following medications to your pharmacy for you to pick up at your convenience:  Pantoprazole  twice a day  Your provider has ordered Diatherix stool testing for you. You have received a kit from our office today containing all necessary supplies to complete this test. Please carefully read the stool collection instructions provided in the kit before opening the accompanying materials. In addition, be sure there is a label providing your full name and date of birth on the puritan opti-swab tube that is supplied in the kit (if you do not see a label with this information on your test tube, please make us  aware before test collection!). After completing the test, you should secure the purtian tube into the specimen biohazard bag. The [facility] E-Req sheet (including date and time of specimen collection) should be placed into the outside pocket of the specimen biohazard bag and returned to the [place] lab (basement floor of [facility]) within 3 days of collection. Please make sure to give the specimen to a staff member at the lab. DO NOT leave the specimen on the counter.   If the specimen date and time (can be found in the upper right boxed portion of the sheet) are not filled out on the E-Req sheet, the test will NOT be performed.   _______________________________________________________  If your blood pressure at your visit was 140/90 or greater, please contact your primary care physician to follow up on this.  _______________________________________________________  If you are age 52 or older, your body mass index should be between 23-30. Your Body mass index is 27.55 kg/m. If this is out of the aforementioned range listed, please consider follow up with your Primary Care Provider.  If you are age 52 or younger, your body mass index should be between 19-25. Your Body mass index is 27.55 kg/m. If this is out of the aformentioned range listed,  please consider follow up with your Primary Care Provider.   ________________________________________________________  The [facility] providers would like to encourage you to use MYCHART to communicate with providers for non-urgent requests or questions.  Due to long hold times on the telephone, sending your provider a message by [facility] may be a faster and more efficient way to get a response.  Please allow 48 business hours for a response.  Please remember that this is for non-urgent requests.  _______________________________________________________  [facility] is using a team-based approach to care.  Your team is made up of your doctor and two to three APPS. Our APPS (Nurse Practitioners and Physician Assistants) work with your physician to ensure care continuity for you. They are fully qualified to address your health concerns and develop a treatment plan. They communicate directly with your gastroenterologist to care for you. Seeing the Advanced Practice Practitioners on your physician's team can help you by facilitating care more promptly, often allowing for earlier appointments, access to diagnostic testing, procedures, and other specialty referrals.

## 2024-12-09 NOTE — Progress Notes (Signed)
 "  Chief Complaint: GERD Primary GI FI:[id number]  HPI:  Discussed the use of AI scribe software for clinical note transcription with the patient, who gave verbal consent to proceed.  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  Underwent sleeve gastrectomy at the end of 2021, after which persistent acid reflux symptoms developed. Describes a burning sensation, wet mouth (notably when wearing retainers at night), and frequent burping. Symptoms are most pronounced at night and late evening, sometimes occurring without food intake, and often awaken her from sleep despite head elevation. Identifies stress as a possible trigger. No associated nausea or pain, but overall discomfort is present.  Currently takes pantoprazole  40 mg once daily, which provides relief for most of the day; symptoms recur in the evening. Uses Tums as needed, particularly before bed, with some relief. Denies regular NSAID use, with only rare ibuprofen use in the past year and no use of BC or Goody powders.  Colonoscopy in 2023 revealed two precancerous polyps, which were removed. No changes in bowel habits, rectal bleeding, or weight loss.  Experiences intermittent abdominal cramping, often associated with the urge to have a bowel movement. Cramping is strong, sometimes requiring her to stand or straighten out, and improves after a bowel movement or with movement. Occasional constipation occurs, but not frequently. These symptoms began after sleeve gastrectomy and were not present prior to surgery. No upper abdominal pain related to eating.  Family history is notable for ulcerative colitis in her father.   PREVIOUS GI WORKUP   Colonoscopy 07/2022 (Dr. Jefrey) IMPRESSIONS and PLAN:  2 small polyps removed  - 07/2029  Past Medical History:  Diagnosis Date   Arthritis    Contact lens/glasses fitting     History of swelling of feet    Hypertension     Past Surgical History:  Procedure Laterality Date   CESAREAN SECTION  1997   LAPAROSCOPIC GASTRIC BANDING  02/23/2008   LAPAROSCOPIC REPAIR AND REMOVAL OF GASTRIC BAND N/A 10/16/2020   Procedure: CONVERSION FROM LAP BAND TO LAPAROSCOPIC GASTRIC SLEEVE - REMOVAL LAP BAND AND COMPONENTS;  Surgeon: Gladis Cough, MD;  Location: [facility];  Service: General;  Laterality: N/A;   UPPER GI ENDOSCOPY N/A 10/16/2020   Procedure: UPPER GI ENDOSCOPY;  Surgeon: Gladis Cough, MD;  Location: [facility];  Service: General;  Laterality: N/A;    Current Outpatient Medications  Medication Sig Dispense Refill   lisinopril (ZESTRIL) 20 MG tablet Take 20 mg by mouth daily.     ondansetron  (ZOFRAN ) 4 MG tablet Take 1 tablet (4 mg total) by mouth every 8 (eight) hours as needed for nausea or vomiting. 20 tablet 0   pantoprazole  (PROTONIX ) 40 MG tablet Take 1 tablet (40 mg total) by mouth 2 (two) times daily. 60 tablet 3   PREVIDENT 5000 BOOSTER PLUS 1.1 % PSTE Place onto teeth 2 (two) times daily.     No current facility-administered medications for this visit.    Allergies as of 12/09/2024   (No Known Allergies)    Family History  Problem Relation Age of Onset   Hypertension Mother    Hypertension Father     Social History   Socioeconomic History   Marital status: Single    Spouse name: Not on file   Number of children: Not on file   Years of education: Not on file   Highest education level:  Not on file  Occupational History   Not on file  Tobacco Use   Smoking status: Never   Smokeless tobacco: Never  Vaping Use   Vaping status: Never Used  Substance and Sexual Activity   Alcohol use: Yes    Comment: ocassionally   Drug use: No   Sexual activity: Yes  Other Topics Concern   Not on file  Social History Narrative   Not on file   Social Drivers of Health   Tobacco Use: Low Risk (12/09/2024)   Patient History    Smoking Tobacco Use:  Never    Smokeless Tobacco Use: Never    Passive Exposure: Not on file  Financial Resource Strain: Low Risk (08/06/2024)   Received from [facility]   Overall Financial Resource Strain (CARDIA)    How hard is it for you to pay for the very basics like food, housing, medical care, and heating?: Not hard at all  Food Insecurity: No Food Insecurity (08/06/2024)   Received from [facility]   Epic    Within the past 12 months, you worried that your food would run out before you got the money to buy more.: Never true    Within the past 12 months, the food you bought just didn't last and you didn't have money to get more.: Never true  Transportation Needs: No Transportation Needs (08/06/2024)   Received from [facility]    In the past 12 months, has lack of transportation kept you from medical appointments or from getting medications?: No    In the past 12 months, has lack of transportation kept you from meetings, work, or from getting things needed for daily living?: No  Physical Activity: Insufficiently Active (08/06/2024)   Received [facility]   Exercise Vital Sign    On average, how many days per week do you engage in moderate to strenuous exercise (like a brisk walk)?: 1 day    On average, how many minutes do you engage in exercise at this level?: 30 min  Stress: No Stress Concern Present (08/06/2024)   Received from [facility] - Occupational Stress Questionnaire    Do you feel stress - tense, restless, nervous, or anxious, or unable to sleep at night because your mind is troubled all the time - these days?: Not at all  Social Connections: Moderately Integrated (08/06/2024)   Received from [facility]   Social Network    How would you rate your social network (family, work, friends)?: Adequate participation with social networks  Intimate Partner Violence: Not At Risk (08/06/2024)   Received from [facility]   HITS    Over  the last 12 months how often did your partner physically hurt you?: Never    Over the last 12 months how often did your partner insult you or talk down to you?: Never    Over the last 12 months how often did your partner threaten you with physical harm?: Never    Over the last 12 months how often did your partner scream or curse at you?: Never  Depression (PHQ2-9): Not on file  Alcohol Screen: Not on file  Housing: Low Risk (08/06/2024)   Received from [facility]    In the last 12 months, was there a time when you were not able to pay the mortgage or rent on time?: No    In the past 12 months, how many times  have you moved where you were living?: 0    At any time in the past 12 months, were you homeless or living in a shelter (including now)?: No  Utilities: Not At Risk (08/06/2024)   Received from [facility]    In the past 12 months has the electric, gas, oil, or water company threatened to shut off services in your home?: No  Health Literacy: Not on file    Review of Systems:    Constitutional: No weight loss, fever, chills, weakness or fatigue HEENT: Eyes: No change in vision               Ears, Nose, Throat:  No change in hearing or congestion Skin: No rash or itching Cardiovascular: No chest pain, chest pressure or palpitations   Respiratory: No SOB or cough Gastrointestinal: See HPI and otherwise negative Genitourinary: No dysuria or change in urinary frequency Neurological: No headache, dizziness or syncope Musculoskeletal: No new muscle or joint pain Hematologic: No bleeding or bruising Psychiatric: No history of depression or anxiety    Physical Exam:  Vital signs: BP 120/80   Pulse 69   Ht 5' 10 (1.778 m)   Wt 192 lb (87.1 kg)   BMI 27.55 kg/m   Constitutional: NAD, alert and cooperative Head:  Normocephalic and atraumatic. Eyes:   PEERL, EOMI. No icterus. Conjunctiva pink. Respiratory: Respirations even and unlabored. Lungs clear to  auscultation bilaterally.   No wheezes, crackles, or rhonchi.  Cardiovascular:  Regular rate and rhythm. No peripheral edema, cyanosis or pallor.  Gastrointestinal:  Soft, nondistended, nontender. No rebound or guarding. Normal bowel sounds. No appreciable masses or hepatomegaly. Rectal:  Declines Msk:  Symmetrical without gross deformities. Without edema, no deformity or joint abnormality.  Neurologic:  Alert and  oriented x4;  grossly normal neurologically.  Skin:   Dry and intact without significant lesions or rashes. Psychiatric: Oriented to person, place and time. Demonstrates good judgement and reason without abnormal affect or behaviors.  Physical Exam    RELEVANT LABS AND IMAGING: CBC    Component Value Date/Time   WBC 11.9 (H) 10/17/2020 0458   RBC 4.32 10/17/2020 0458   HGB 12.8 10/17/2020 0458   HCT 38.0 10/17/2020 0458   PLT 264 10/17/2020 0458   MCV 88.0 10/17/2020 0458   MCH 29.6 10/17/2020 0458   MCHC 33.7 10/17/2020 0458   RDW 12.9 10/17/2020 0458   LYMPHSABS 0.8 10/17/2020 0458   MONOABS 0.7 10/17/2020 0458   EOSABS 0.0 10/17/2020 0458   BASOSABS 0.0 10/17/2020 0458    CMP     Component Value Date/Time   NA 135 10/05/2020 1447   K 4.1 10/05/2020 1447   CL 100 10/05/2020 1447   CO2 24 10/05/2020 1447   GLUCOSE 78 10/05/2020 1447   BUN 18 10/05/2020 1447   CREATININE 1.04 (H) 10/16/2020 1221   CALCIUM 9.3 10/05/2020 1447   PROT 7.6 10/05/2020 1447   ALBUMIN 4.2 10/05/2020 1447   AST 20 10/05/2020 1447   ALT 19 10/05/2020 1447   ALKPHOS 55 10/05/2020 1447   BILITOT 1.2 10/05/2020 1447   GFRNONAA >60 10/16/2020 1221     Assessment/Plan:    Gastroesophageal reflux disease Chronic symptoms post-sleeve gastrectomy with partial response to pantoprazole . Differential includes anastomotic ulcer and H. pylori infection. Medication escalation expected to improve symptoms; persistent symptoms may require endoscopy to rule out esophagitis, ulceration, or  infection. NSAID use discouraged. Patient would prefer to hold off on EGD at  this time - Increased pantoprazole  to 40 mg twice daily, 30 minutes before breakfast and dinner. - Recommended lifestyle modifications: avoid spicy/acidic foods, elevate head of bed, avoid eating close to bedtime. - h pylori diatherix - Advised to avoid NSAIDs such as ibuprofen. - Planned follow-up in 6 weeks to reassess symptoms. - If symptoms persist, will schedule endoscopy to evaluate for esophagitis, ulcers, and H. pylori infection. - If escalation of acid suppression does not resolve symptoms, will order stool study for H. pylori prior to endoscopy.  Irritable bowel syndrome Intermittent abdominal cramping and occasional constipation consistent with IBS, without alarming features. Symptoms may be exacerbated by dietary factors and stool consistency. - Recommended initiation of fiber supplement (Benefiber or Metamucil) to improve stool consistency and reduce cramping. - Advised to monitor dietary triggers, especially raw vegetables, and observe correlation between symptoms and food intake. - Discussed IB Guard (peppermint oil capsules) as an option, with caution regarding potential exacerbation of GERD symptoms.  History of colon polyps Colonoscopy 07/2022 (Dr. Jefrey) IMPRESSIONS and PLAN:  2 small polyps removed  - repeat 07/2029  Assigned to Dr. Albertus today  Nestor Blower, PA-C [facility] 12/09/2024, 3:56 PM  Cc: Vonita Quince, [place] "

## 2025-01-24 ENCOUNTER — Ambulatory Visit: Admitting: Gastroenterology
